# Patient Record
Sex: Female | Born: 1985 | Race: Black or African American | Hispanic: No | Marital: Single | Smoking: Never smoker
Health system: Southern US, Community
[De-identification: ages and names within clinical notes are randomized; demographics above are authoritative.]

## PROBLEM LIST (undated history)

## (undated) DIAGNOSIS — Z789 Other specified health status: Secondary | ICD-10-CM

---
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
## 2007-09-06 ENCOUNTER — Inpatient Hospital Stay (HOSPITAL_COMMUNITY): Admission: AD | Admit: 2007-09-06 | Discharge: 2007-09-06 | Payer: Self-pay | Admitting: Obstetrics & Gynecology

## 2007-09-12 ENCOUNTER — Inpatient Hospital Stay (HOSPITAL_COMMUNITY): Admission: AD | Admit: 2007-09-12 | Discharge: 2007-09-12 | Payer: Self-pay | Admitting: Obstetrics

## 2007-10-30 ENCOUNTER — Inpatient Hospital Stay (HOSPITAL_COMMUNITY): Admission: AD | Admit: 2007-10-30 | Discharge: 2007-11-05 | Payer: Self-pay | Admitting: Obstetrics

## 2007-11-02 ENCOUNTER — Encounter (INDEPENDENT_AMBULATORY_CARE_PROVIDER_SITE_OTHER): Payer: Self-pay | Admitting: Obstetrics

## 2007-11-14 ENCOUNTER — Emergency Department (HOSPITAL_COMMUNITY): Admission: EM | Admit: 2007-11-14 | Discharge: 2007-11-14 | Payer: Self-pay | Admitting: Family Medicine

## 2007-12-18 ENCOUNTER — Emergency Department (HOSPITAL_COMMUNITY): Admission: EM | Admit: 2007-12-18 | Discharge: 2007-12-18 | Payer: Self-pay | Admitting: Emergency Medicine

## 2007-12-19 ENCOUNTER — Emergency Department (HOSPITAL_COMMUNITY): Admission: EM | Admit: 2007-12-19 | Discharge: 2007-12-19 | Payer: Self-pay | Admitting: Emergency Medicine

## 2008-09-02 ENCOUNTER — Emergency Department (HOSPITAL_COMMUNITY): Admission: EM | Admit: 2008-09-02 | Discharge: 2008-09-02 | Payer: Self-pay | Admitting: Emergency Medicine

## 2008-09-03 ENCOUNTER — Emergency Department (HOSPITAL_COMMUNITY): Admission: EM | Admit: 2008-09-03 | Discharge: 2008-09-03 | Payer: Self-pay | Admitting: Emergency Medicine

## 2009-04-28 ENCOUNTER — Emergency Department (HOSPITAL_COMMUNITY): Admission: EM | Admit: 2009-04-28 | Discharge: 2009-04-28 | Payer: Self-pay | Admitting: Emergency Medicine

## 2010-09-15 ENCOUNTER — Emergency Department (HOSPITAL_COMMUNITY): Admission: EM | Admit: 2010-09-15 | Discharge: 2010-09-15 | Payer: Self-pay | Admitting: Emergency Medicine

## 2011-01-30 ENCOUNTER — Inpatient Hospital Stay (HOSPITAL_COMMUNITY): Admit: 2011-01-30 | Payer: Self-pay | Admitting: Obstetrics & Gynecology

## 2011-04-01 ENCOUNTER — Emergency Department (HOSPITAL_COMMUNITY)
Admission: EM | Admit: 2011-04-01 | Discharge: 2011-04-01 | Disposition: A | Discharge status: Home / Self Care | Payer: Self-pay | Attending: Emergency Medicine | Admitting: Emergency Medicine

## 2011-04-01 DIAGNOSIS — R51 Headache: Secondary | ICD-10-CM | POA: Insufficient documentation

## 2011-04-01 DIAGNOSIS — W2209XA Striking against other stationary object, initial encounter: Secondary | ICD-10-CM | POA: Insufficient documentation

## 2011-04-01 DIAGNOSIS — Y990 Civilian activity done for income or pay: Secondary | ICD-10-CM | POA: Insufficient documentation

## 2011-04-01 DIAGNOSIS — S0990XA Unspecified injury of head, initial encounter: Secondary | ICD-10-CM | POA: Insufficient documentation

## 2011-04-26 NOTE — Op Note (Signed)
NAMEMADDEN, PIAZZA               ACCOUNT NO.:  [id number]   MEDICAL RECORD NO.:  [id number]          PATIENT TYPE:  INP   LOCATION:  9303                          FACILITY:  [facility]   PHYSICIAN:  Kathreen Cosier, M.D.DATE OF BIRTH:  06-07-1986   DATE OF PROCEDURE:  11/02/2007  DATE OF DISCHARGE:                               OPERATIVE REPORT   PREOPERATIVE DIAGNOSIS:  Intrauterine pregnancy at 34 weeks and 3 days  with prolonged ruptured membranes, breech presentation, and early labor.   POSTOPERATIVE DIAGNOSIS:  Intrauterine pregnancy at 34 weeks and 3 days  with prolonged ruptured membranes, breech presentation, and early labor.   SURGEON:  Kathreen Cosier, M.D.   ANESTHESIA:  Spinal.   PROCEDURE:  The patient placed on the operating room table in the supine  position after the spinal administered.  Abdomen prepped and draped,  bladder emptied with Foley catheter.   Transverse suprapubic incision made and carried down to the rectus  fascia.  Fascia cleaned and incised the length of the incision.  The  recti muscles were retracted laterally.  The peritoneum incised  longitudinally.  Transverse incision made in the visceral peritoneum  above the bladder.  Bladder mobilized inferiorly.  Transverse lower  uterine incision made, and the patient delivered from the frank breech  position of a female, Apgar are 4 and 9, weighing 3 pounds 9.5 ounces.  Team in attendance.  The pH was 7.31.  The placenta was posterior and  removed manually and sent to pathology.  Uterine cavity cleaned with dry  laps.  Uterine incision closed in one layer with continuous suture of #1  chromic.  Hemostasis satisfactory.  Bladder flap reattached with 2-0  chromic.  Uterus well-contracted.  Tubes and ovaries normal.  Abdomen  closed in layers, peritoneum continuous suture of 0 chromic, fascia  continuous suture of 0 Dexon, skin closed with subcuticular stitch of 4-  0 Monocryl.  Blood loss 250  mL.           ______________________________  Kathreen Cosier, M.D.     BAM/MEDQ  D:  11/02/2007  T:  11/04/2007  Job:  [id number]

## 2011-04-26 NOTE — H&P (Signed)
NAMERASHEDA, LEDGER               ACCOUNT NO.:  [id number]   MEDICAL RECORD NO.:  [id number]          PATIENT TYPE:  INP   LOCATION:  9303                          FACILITY:  [facility]   PHYSICIAN:  Kathreen Cosier, M.D.DATE OF BIRTH:  01/21/1986   DATE OF ADMISSION:  10/30/2007  DATE OF DISCHARGE:                              HISTORY & PHYSICAL   HISTORY OF PRESENT ILLNESS:  The patient is an 25 year old primigravida,  [facility] December 12, 2007.  She was admitted on November 18 with ruptured  membranes which occurred spontaneously at 9:00 p.m. on the 17th.  She  was having occasional contractions.  Speculum exam showed large amount  yeast and a large amount of clear amniotic fluid from the cervix.  The  patient was started on ampicillin 2 grams IV every 6 hours and  dexamethasone 6 mg IM q. 12 hours for four doses.  She was started on  magnesium sulfate 4 grams loading and 2 grams an hour.  Ultrasound  revealed a frank breech presentation.  The patient was kept on bedrest  and used approximately six pads per day.  On the evening of November 21,  she started contracting every 5-7 minutes, and the cervix was 1 cm, 90%  with a breech at -3 station.  It was decided she would be delivered by C-  section because of breech presentation and ruptured membranes in labor.   PHYSICAL EXAMINATION:  GENERAL:  A well-developed female in no distress.  HEENT: Negative.  LUNGS: Clear.  HEART: Regular rhythm.  No murmurs or gallops.  BREASTS:  No masses.  ABDOMEN: Uterine size 32-34 weeks.  EXTREMITIES:  Negative.           ______________________________  Kathreen Cosier, M.D.     BAM/MEDQ  D:  11/02/2007  T:  11/03/2007  Job:  [id number]

## 2011-04-29 NOTE — Discharge Summary (Signed)
NAMEKRISTALYN, Patricia Parrish               ACCOUNT NO.:  [id number]   MEDICAL RECORD NO.:  [id number]          PATIENT TYPE:  INP   LOCATION:  9303                          [facility]ACILITY:  [facility]   PHYSICIAN:  Kathreen Cosier, M.D.DATE OF BIRTH:  Jul 20, 1986   DATE OF ADMISSION:  10/30/2007  DATE OF DISCHARGE:  11/05/2007                               DISCHARGE SUMMARY   HISTORY OF PRESENT ILLNESS:  The patient is an 25 year old gravida 1,  EDC December 12, 2007 who was admitted with ruptured membranes which  occurred on November 17.  She was having occasional contractions and she  had clear fluid.  She was 33+ weeks and she was given dexamethasone, and  started on magnesium sulfate and ampicillin 2 grams IV every 6 hours.  She was also noted to be a breech presentation.  On November 21, the  patient went into labor, and at that time she was delivered by C-section  and had a frank breech female 3 pounds, 9.5 ounces, Apgar 4 and 9, pH  7.31.  She did well.  Postop her hemoglobin was 11.2.  She was  discharged home on postoperative day #3, ambulatory, on a regular diet,  to see me in 6 weeks.   DISCHARGE DIAGNOSIS:  Status post premature ruptured membranes of 33-34  weeks, breech presentation, labor and primary low transverse cesarean  section because of presentation and the presence of labor.           ______________________________  Kathreen Cosier, M.D.     BAM/MEDQ  D:  11/21/2007  T:  11/21/2007  Job:  [id number]

## 2011-09-01 LAB — DIFFERENTIAL
Lymphocytes Relative: 8 — ABNORMAL LOW
Lymphs Abs: 0.6 — ABNORMAL LOW
Monocytes Relative: 6
Neutro Abs: 6.4
Neutrophils Relative %: 85 — ABNORMAL HIGH

## 2011-09-01 LAB — WOUND CULTURE

## 2011-09-01 LAB — CBC
MCV: 87.4
Platelets: 233
RBC: 4.38
WBC: 7.5

## 2011-09-01 LAB — BASIC METABOLIC PANEL
BUN: 5 — ABNORMAL LOW
Calcium: 9.6
Chloride: 107
Creatinine, Ser: 0.75
GFR calc Af Amer: >60
GFR calc non Af Amer: >60

## 2011-09-12 LAB — POCT I-STAT, CHEM 8
BUN: 10
Chloride: 107
Creatinine, Ser: 0.9
Glucose, Bld: 95
Hemoglobin: 12.6
Potassium: 3.7
Sodium: 142

## 2011-09-12 LAB — POCT CARDIAC MARKERS
CKMB, poc: <1 — ABNORMAL LOW
Troponin i, poc: <0.05

## 2011-09-20 LAB — CBC
HCT: 41
Hemoglobin: 11.2 — ABNORMAL LOW
Hemoglobin: 14.1
MCHC: 34.1
MCV: 94.6
RBC: 3.47 — ABNORMAL LOW
RDW: 13.2
WBC: 11.9 — ABNORMAL HIGH
WBC: 7.6

## 2011-09-22 LAB — URINE MICROSCOPIC-ADD ON

## 2011-09-22 LAB — URINALYSIS, ROUTINE W REFLEX MICROSCOPIC
Glucose, UA: NEGATIVE
Glucose, UA: NEGATIVE
Hgb urine dipstick: NEGATIVE
Ketones, ur: NEGATIVE
Specific Gravity, Urine: 1.02
pH: 6
pH: 7

## 2015-05-26 ENCOUNTER — Emergency Department: Payer: Self-pay

## 2015-05-26 ENCOUNTER — Emergency Department
Admission: EM | Admit: 2015-05-26 | Discharge: 2015-05-26 | Disposition: A | Discharge status: Home / Self Care | Payer: Self-pay | Attending: Emergency Medicine | Admitting: Emergency Medicine

## 2015-05-26 ENCOUNTER — Encounter: Payer: Self-pay | Admitting: *Deleted

## 2015-05-26 DIAGNOSIS — M779 Enthesopathy, unspecified: Secondary | ICD-10-CM | POA: Insufficient documentation

## 2015-05-26 DIAGNOSIS — M7750 Other enthesopathy of unspecified foot: Secondary | ICD-10-CM

## 2015-05-26 MED ORDER — NAPROXEN 500 MG PO TABS
500.0000 mg | ORAL_TABLET | Freq: Once | ORAL | Status: AC
  Administered 2015-05-26: 500 mg via ORAL

## 2015-05-26 MED ORDER — NAPROXEN 500 MG PO TABS
ORAL_TABLET | ORAL | Status: AC
  Administered 2015-05-26: 500 mg via ORAL
  Filled 2015-05-26: qty 1

## 2015-05-26 MED ORDER — TRAMADOL HCL 50 MG PO TABS
50.0000 mg | ORAL_TABLET | Freq: Once | ORAL | Status: AC
  Administered 2015-05-26: 50 mg via ORAL

## 2015-05-26 MED ORDER — TRAMADOL HCL 50 MG PO TABS
ORAL_TABLET | ORAL | Status: AC
  Administered 2015-05-26: 50 mg via ORAL
  Filled 2015-05-26: qty 1

## 2015-05-26 MED ORDER — MELOXICAM 15 MG PO TABS: 15.0000 mg | ORAL_TABLET | Freq: Every day | ORAL | Status: DC

## 2015-05-26 MED ORDER — TRAMADOL HCL 50 MG PO TABS: 50.0000 mg | ORAL_TABLET | Freq: Four times a day (QID) | ORAL | Status: DC | PRN

## 2015-05-26 NOTE — Discharge Instructions (Signed)

## 2015-05-26 NOTE — ED Provider Notes (Signed)
[facility] Emergency Department Provider Note  ____________________________________________  Time seen: Approximately 7:20 PM  I have reviewed the triage vital signs and the nursing notes.   HISTORY  Chief Complaint Foot Pain  Right foot pain  HPI Patricia Parrish is a 29 y.o. female who presents to the emergency department for sudden onset right foot pain. She denies injury or previous symptoms of the same. Pain is 7 out of 10. She has not taken any medications at home for the pain. Pain is worse with ambulation.She tells me that she is a Production designer, theatre/television/film at [employer] and is on her feet several hours per day.  History reviewed. No pertinent past medical history.  There are no active problems to display for this patient.   History reviewed. No pertinent past surgical history.  Current Outpatient Rx  Name  Route  Sig  Dispense  Refill  . meloxicam (MOBIC) 15 MG tablet   Oral   Take 1 tablet (15 mg total) by mouth daily.   30 tablet   2   . traMADol (ULTRAM) 50 MG tablet   Oral   Take 1 tablet (50 mg total) by mouth every 6 (six) hours as needed.   9 tablet   0     Allergies Review of patient's allergies indicates no known allergies.  History reviewed. No pertinent family history.  Social History History  Substance Use Topics  . Smoking status: Never Smoker   . Smokeless tobacco: Not on file  . Alcohol Use: No    Review of Systems Constitutional: No recent illness. Eyes: No visual changes. ENT: No sore throat. Cardiovascular: Denies chest pain or palpitations. Respiratory: Denies shortness of breath. Gastrointestinal: No abdominal pain.  Genitourinary: Negative for dysuria. Musculoskeletal: Pain in right foot Skin: Negative for rash. Neurological: Negative for headaches, focal weakness or numbness. 10-point ROS otherwise negative.  ____________________________________________   PHYSICAL EXAM:  VITAL SIGNS: ED Triage Vitals  Enc  Vitals Group     BP --      Pulse --      Resp --      Temp --      Temp src --      SpO2 --      Weight --      Height --      Head Cir --      Peak Flow --      Pain Score --      Pain Loc --      Pain Edu? --      Excl. in GC? --     Constitutional: Alert and oriented. Well appearing and in no acute distress. Eyes: Conjunctivae are normal. EOMI. Head: Atraumatic. Nose: No congestion/rhinnorhea. Neck: No stridor.  Respiratory: Normal respiratory effort.   Musculoskeletal: Right midfoot tenderness to palpation without obvious injury. [place] ankle rules are negative. Neurologic:  Normal speech and language. No gross focal neurologic deficits are appreciated. Speech is normal. Gait not tested due to pain. Skin:  Skin is warm, dry and intact. Atraumatic. Psychiatric: Mood and affect are normal. Speech and behavior are normal.  ____________________________________________   LABS (all labs ordered are listed, but only abnormal results are displayed)  Labs Reviewed - No data to display ____________________________________________  RADIOLOGY  Negative for acute abnormality ____________________________________________   PROCEDURES  Procedure(s) performed: Ace bandage applied by ER tech. Neurovascularly intact post-splint application.   ____________________________________________   INITIAL IMPRESSION / ASSESSMENT AND PLAN / ED COURSE  Pertinent labs &  imaging results that were available during my care of the patient were reviewed by me and considered in my medical decision making (see chart for details).  Patient was advised to keep the foot wrapped when she has to work. She was advised to follow up with the podiatrist for symptoms are not improving over the week. She was advised to return to the emergency department for symptoms change or worsen if she is unable to schedule an appointment. ____________________________________________   FINAL CLINICAL IMPRESSION(S)  / ED DIAGNOSES  Final diagnoses:  Tendonitis of ankle or foot      Chinita Pester, FNP 05/26/15 2013  Minna Antis, MD 05/26/15 [phone]

## 2016-09-29 ENCOUNTER — Emergency Department (HOSPITAL_COMMUNITY)
Admission: EM | Admit: 2016-09-29 | Discharge: 2016-09-29 | Disposition: A | Discharge status: Home / Self Care | Payer: No Typology Code available for payment source | Attending: Physician Assistant | Admitting: Physician Assistant

## 2016-09-29 ENCOUNTER — Encounter (HOSPITAL_COMMUNITY): Payer: Self-pay

## 2016-09-29 DIAGNOSIS — Y999 Unspecified external cause status: Secondary | ICD-10-CM | POA: Diagnosis not present

## 2016-09-29 DIAGNOSIS — M25512 Pain in left shoulder: Secondary | ICD-10-CM | POA: Insufficient documentation

## 2016-09-29 DIAGNOSIS — Y939 Activity, unspecified: Secondary | ICD-10-CM | POA: Diagnosis not present

## 2016-09-29 DIAGNOSIS — Y92410 Unspecified street and highway as the place of occurrence of the external cause: Secondary | ICD-10-CM | POA: Insufficient documentation

## 2016-09-29 DIAGNOSIS — M7918 Myalgia, other site: Secondary | ICD-10-CM

## 2016-09-29 MED ORDER — IBUPROFEN 400 MG PO TABS: 400.0000 mg | ORAL_TABLET | Freq: Once | ORAL | Status: DC

## 2016-09-29 MED ORDER — METHOCARBAMOL 500 MG PO TABS: 500.0000 mg | ORAL_TABLET | Freq: Two times a day (BID) | ORAL | 0 refills | Status: DC

## 2016-09-29 MED ORDER — METHOCARBAMOL 500 MG PO TABS: 500.0000 mg | ORAL_TABLET | Freq: Once | ORAL | Status: DC
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
## 2017-05-14 ENCOUNTER — Emergency Department: Payer: Self-pay

## 2017-05-14 ENCOUNTER — Emergency Department
Admission: EM | Admit: 2017-05-14 | Discharge: 2017-05-14 | Disposition: A | Discharge status: Home / Self Care | Payer: Self-pay | Attending: Emergency Medicine | Admitting: Emergency Medicine

## 2017-05-14 ENCOUNTER — Encounter: Payer: Self-pay | Admitting: Emergency Medicine

## 2017-05-14 DIAGNOSIS — M436 Torticollis: Secondary | ICD-10-CM | POA: Insufficient documentation

## 2017-05-14 MED ORDER — KETOROLAC TROMETHAMINE 30 MG/ML IJ SOLN
15.0000 mg | Freq: Once | INTRAMUSCULAR | Status: AC
  Administered 2017-05-14: 15 mg via INTRAMUSCULAR
  Filled 2017-05-14: qty 1

## 2017-05-14 MED ORDER — KETOROLAC TROMETHAMINE 10 MG PO TABS: 10.0000 mg | ORAL_TABLET | Freq: Four times a day (QID) | ORAL | 0 refills | Status: DC | PRN

## 2017-05-14 MED ORDER — CYCLOBENZAPRINE HCL 10 MG PO TABS: 10.0000 mg | ORAL_TABLET | Freq: Three times a day (TID) | ORAL | 0 refills | Status: DC | PRN

## 2017-05-14 MED ORDER — ORPHENADRINE CITRATE 30 MG/ML IJ SOLN
60.0000 mg | Freq: Two times a day (BID) | INTRAMUSCULAR | Status: DC
  Administered 2017-05-14: 60 mg via INTRAMUSCULAR
  Filled 2017-05-14: qty 2
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
## 2018-01-25 ENCOUNTER — Encounter: Payer: Self-pay | Admitting: Internal Medicine

## 2018-01-25 ENCOUNTER — Ambulatory Visit (INDEPENDENT_AMBULATORY_CARE_PROVIDER_SITE_OTHER): Payer: Managed Care, Other (non HMO) | Admitting: Internal Medicine

## 2018-01-25 VITALS — BP 114/72 | HR 70 | Temp 98.2°F | Ht 65.75 in | Wt 126.0 lb

## 2018-01-25 DIAGNOSIS — Z0000 Encounter for general adult medical examination without abnormal findings: Secondary | ICD-10-CM

## 2018-01-25 LAB — LIPID PANEL
Cholesterol: 97 mg/dL (ref 0–200)
HDL: 55.1 mg/dL (ref >39.00)
LDL Cholesterol: 32 mg/dL (ref 0–99)
NONHDL: 41.9
Total CHOL/HDL Ratio: 2
Triglycerides: 48 mg/dL (ref 0.0–149.0)
VLDL: 9.6 mg/dL (ref 0.0–40.0)

## 2018-01-25 LAB — CBC
HEMATOCRIT: 35.7 % — AB (ref 36.0–46.0)
HEMOGLOBIN: 11.5 g/dL — AB (ref 12.0–15.0)
MCHC: 32.3 g/dL (ref 30.0–36.0)
MCV: 86.5 fl (ref 78.0–100.0)
Platelets: 189 10*3/uL (ref 150.0–400.0)
RBC: 4.13 Mil/uL (ref 3.87–5.11)
RDW: 13.9 % (ref 11.5–15.5)
WBC: 4.1 10*3/uL (ref 4.0–10.5)

## 2018-01-25 LAB — COMPREHENSIVE METABOLIC PANEL
ALK PHOS: 54 U/L (ref 39–117)
ALT: 8 U/L (ref 0–35)
AST: 10 U/L (ref 0–37)
Albumin: 4.2 g/dL (ref 3.5–5.2)
BUN: 8 mg/dL (ref 6–23)
CO2: 29 meq/L (ref 19–32)
Calcium: 8.6 mg/dL (ref 8.4–10.5)
Chloride: 105 mEq/L (ref 96–112)
Creatinine, Ser: 0.82 mg/dL (ref 0.40–1.20)
GFR: 105.89 mL/min (ref >60.00)
GLUCOSE: 88 mg/dL (ref 70–99)
POTASSIUM: 3.7 meq/L (ref 3.5–5.1)
Sodium: 139 mEq/L (ref 135–145)
Total Bilirubin: 0.7 mg/dL (ref 0.2–1.2)
Total Protein: 7 g/dL (ref 6.0–8.3)

## 2018-01-25 NOTE — Progress Notes (Signed)
HPI  Pt presents to the clinic today to establish care. She has not had a PCP in many years. She would like her annual exam today.  Flu: never Tetanus: unsure Pap Smear: 2017, [facility] Dentist : as needed  Diet: She does eat meat. She consumes fruits and veggies daily. She occassionally eats fried foods. She drinks mostly water and juice. Exercise: Walking.  History reviewed. No pertinent past medical history.  No current outpatient medications on file.   No current facility-administered medications for this visit.     No Known Allergies  Family History  Problem Relation Age of Onset  . Hypertension Mother   . Hypertension Father   . Hypertension Maternal Grandmother   . Hypertension Maternal Grandfather   . Diabetes Paternal Grandmother   . Diabetes Paternal Grandfather     Social History   Socioeconomic History  . Marital status: Single    Spouse name: Not on file  . Number of children: Not on file  . Years of education: Not on file  . Highest education level: Not on file  Social Needs  . Financial resource strain: Not on file  . Food insecurity - worry: Not on file  . Food insecurity - inability: Not on file  . Transportation needs - medical: Not on file  . Transportation needs - non-medical: Not on file  Occupational History  . Not on file  Tobacco Use  . Smoking status: Never Smoker  . Smokeless tobacco: Never Used  Substance and Sexual Activity  . Alcohol use: Yes    Comment: occasional   . Drug use: No  . Sexual activity: Not on file  Other Topics Concern  . Not on file  Social History Narrative  . Not on file    ROS:  Constitutional: Denies fever, malaise, fatigue, headache or abrupt weight changes.  HEENT: Denies eye pain, eye redness, ear pain, ringing in the ears, wax buildup, runny nose, nasal congestion, bloody nose, or sore throat. Respiratory: Denies difficulty breathing, shortness of breath, cough or sputum  production.   Cardiovascular: Denies chest pain, chest tightness, palpitations or swelling in the hands or feet.  Gastrointestinal: Denies abdominal pain, bloating, constipation, diarrhea or blood in the stool.  GU: Denies frequency, urgency, pain with urination, blood in urine, odor or discharge. Musculoskeletal: Denies decrease in range of motion, difficulty with gait, muscle pain or joint pain and swelling.  Skin: Denies redness, rashes, lesions or ulcercations.  Neurological: Denies dizziness, difficulty with memory, difficulty with speech or problems with balance and coordination.  Psych: Denies anxiety, depression, SI/HI.  No other specific complaints in a complete review of systems (except as listed in HPI above).  PE:  BP 114/72   Pulse 70   Temp 98.2 F (36.8 C) (Oral)   Ht 5' 5.75" (1.67 m)   Wt 126 lb (57.2 kg)   LMP 01/24/2018   SpO2 100%   BMI 20.49 kg/m  Wt Readings from Last 3 Encounters:  01/25/18 126 lb (57.2 kg)  05/14/17 125 lb (56.7 kg)  05/26/15 123 lb (55.8 kg)    General: Appears her stated age, well developed, well nourished in NAD. HEENT: Head: normal shape and size; Eyes: sclera Ghazarian, no icterus, conjunctiva pink, PERRLA and EOMs intact; Ears: Tm's gray and intact, normal light reflex;Throat/Mouth: Teeth present, mucosa pink and moist, no lesions or ulcerations noted.  Neck: Neck supple, trachea midline. No masses, lumps or thyromegaly present.  Cardiovascular: Normal rate and rhythm. S1,S2  noted.  No murmur, rubs or gallops noted. No JVD or BLE edema.  Pulmonary/Chest: Normal effort and positive vesicular breath sounds. No respiratory distress. No wheezes, rales or ronchi noted.  Abdomen: Soft and nontender. Normal bowel sounds. No distention or masses noted. Liver, spleen and kidneys non palpable. Musculoskeletal:  Strength 5/5 BUE/BLE. No difficulty with gait.  Neurological: Alert and oriented. Cranial nerves II-XII grossly intact. Coordination  normal.  Psychiatric: Mood and affect normal. Behavior is normal. Judgment and thought content normal.    BMET    Component Value Date/Time   NA 142 09/02/2008 2108   K 3.7 09/02/2008 2108   CL 107 09/02/2008 2108   CO2 25 12/19/2007 1548   GLUCOSE 95 09/02/2008 2108   BUN 10 09/02/2008 2108   CREATININE 0.9 09/02/2008 2108   CALCIUM 9.6 12/19/2007 1548   GFRNONAA >60 12/19/2007 1548   GFRAA  12/19/2007 1548    >60        The eGFR has been calculated using the MDRD equation. This calculation has not been validated in all clinical situations. eGFR's persistently <60 mL/min signify possible Chronic Kidney Disease.    Lipid Panel  No results found for: CHOL, TRIG, HDL, CHOLHDL, VLDL, LDLCALC  CBC    Component Value Date/Time   WBC 7.5 12/19/2007 1548   RBC 4.38 12/19/2007 1548   HGB 12.6 09/02/2008 2108   HCT 37.0 09/02/2008 2108   PLT 233 12/19/2007 1548   MCV 87.4 12/19/2007 1548   MCHC 33.8 12/19/2007 1548   RDW 13.2 12/19/2007 1548   LYMPHSABS 0.6 (L) 12/19/2007 1548   MONOABS 0.5 12/19/2007 1548   EOSABS 0.0 12/19/2007 1548   BASOSABS 0.0 12/19/2007 1548    Hgb A1C No results found for: HGBA1C   Assessment and Plan:  Preventative Health Maintenance:  She declines flu or tetanus vaccine Pap smear UTD Encouraged her to consume a balanced diet and exercise regimen Advised her to see a dentist annually Will check CBC, CMET, and Lipid profile today  RTC in 1 year, sooner if needed Webb Silversmith, NP

## 2018-01-25 NOTE — Patient Instructions (Signed)

## 2018-02-27 ENCOUNTER — Other Ambulatory Visit: Payer: Self-pay

## 2018-02-27 ENCOUNTER — Encounter: Payer: Self-pay | Admitting: Emergency Medicine

## 2018-02-27 DIAGNOSIS — J040 Acute laryngitis: Secondary | ICD-10-CM | POA: Diagnosis not present

## 2018-02-27 DIAGNOSIS — R05 Cough: Secondary | ICD-10-CM | POA: Diagnosis not present

## 2018-02-27 NOTE — ED Triage Notes (Signed)
Pt arrived to the ED for complaints of loosing her voice and cough for one day. Pt denies any fever or being around anyone that is sick. Pt is AOx4 in no apparent distress.

## 2018-02-28 ENCOUNTER — Emergency Department
Admission: EM | Admit: 2018-02-28 | Discharge: 2018-02-28 | Disposition: A | Discharge status: Home / Self Care | Payer: Managed Care, Other (non HMO) | Attending: Emergency Medicine | Admitting: Emergency Medicine

## 2018-02-28 DIAGNOSIS — R059 Cough, unspecified: Secondary | ICD-10-CM

## 2018-02-28 DIAGNOSIS — J040 Acute laryngitis: Secondary | ICD-10-CM

## 2018-02-28 DIAGNOSIS — R05 Cough: Secondary | ICD-10-CM

## 2018-02-28 MED ORDER — BENZONATATE 100 MG PO CAPS: 100.0000 mg | ORAL_CAPSULE | Freq: Four times a day (QID) | ORAL | 0 refills | Status: DC | PRN

## 2018-02-28 NOTE — Discharge Instructions (Signed)
Please follow up with your primary care physician for further evaluation of your symptoms.  °

## 2018-02-28 NOTE — ED Provider Notes (Signed)
[facility] Emergency Department Provider Note   ____________________________________________   First MD Initiated Contact with Patient 02/28/18 [phone]     (approximate)  I have reviewed the triage vital signs and the nursing notes.   HISTORY  Chief Complaint Cough    HPI Patricia Parrish is a 32 y.o. female who comes into the hospital todaywith some cough and hoarseness of her voice.  The patient states that she lost her voice approximately 5 days ago and for the past 2 days she has had a bad cough.  The patient reports that the cough is mainly during the night and in the morning.  The patient has not taken any medication for her symptoms and she denies any sick contacts or fevers.  The patient states that today at work she was feeling weak and had some shortness of breath.  The patient reports that the cough is nonproductive.  She does have some chest discomfort when she coughs but she denies nausea or vomiting.  The patient is here today for evaluation of her symptoms.   History reviewed. No pertinent past medical history.  There are no active problems to display for this patient.   History reviewed. No pertinent surgical history.  Prior to Admission medications   Medication Sig Start Date End Date Taking? Authorizing Provider  benzonatate (TESSALON PERLES) 100 MG capsule Take 1 capsule (100 mg total) by mouth every 6 (six) hours as needed for cough. 02/28/18   Rebecka ApleyWebster, Allison P, MD    Allergies Patient has no known allergies.  Family History  Problem Relation Age of Onset  . Hypertension Mother   . Hypertension Father   . Hypertension Maternal Grandmother   . Hypertension Maternal Grandfather   . Diabetes Paternal Grandmother   . Diabetes Paternal Grandfather     Social History Social History   Tobacco Use  . Smoking status: Never Smoker  . Smokeless tobacco: Never Used  Substance Use Topics  . Alcohol use: Yes    Comment: occasional    . Drug use: No    Review of Systems  Constitutional: No fever/chills Eyes: No visual changes. ENT: No sore throat. Cardiovascular:  chest pain with cough Respiratory: cough and shortness of breath. Gastrointestinal: No abdominal pain.   Genitourinary: Negative for dysuria. Musculoskeletal: Negative for back pain. Skin: Negative for rash. Neurological: Negative for headaches,    ____________________________________________   PHYSICAL EXAM:  VITAL SIGNS: ED Triage Vitals  Enc Vitals Group     BP 02/27/18 2250 (!) 106/52     Pulse Rate 02/27/18 2250 84     Resp 02/27/18 2250 16     Temp 02/27/18 2250 98.1 F (36.7 C)     Temp Source 02/27/18 2250 Oral     SpO2 02/27/18 2250 100 %     Weight 02/27/18 2252 127 lb (57.6 kg)     Height 02/27/18 2252 5\' 6"  (1.676 m)     Head Circumference --      Peak Flow --      Pain Score 02/27/18 2305 0     Pain Loc --      Pain Edu? --      Excl. in GC? --     Constitutional: Alert and oriented. Well appearing and in no acute distress. Eyes: Conjunctivae are normal. PERRL. EOMI. Head: Atraumatic. Nose: No congestion/rhinnorhea. Mouth/Throat: Mucous membranes are moist.  Oropharynx non-erythematous. Cardiovascular: Normal rate, regular rhythm. Grossly normal heart sounds.  Good peripheral circulation. Respiratory:  Normal respiratory effort.  No retractions. Lungs CTAB. Gastrointestinal: Soft and nontender. No distention.  Musculoskeletal: No lower extremity tenderness nor edema.   Neurologic:  Normal speech and language.  Skin:  Skin is warm, dry and intact.  Psychiatric: Mood and affect are normal. S  ____________________________________________   LABS (all labs ordered are listed, but only abnormal results are displayed)  Labs Reviewed - No data to display ____________________________________________  EKG  none ____________________________________________  RADIOLOGY  ED MD interpretation:  none  Official  radiology report(s): No results found.  ____________________________________________   PROCEDURES  Procedure(s) performed: None  Procedures  Critical Care performed: No  ____________________________________________   INITIAL IMPRESSION / ASSESSMENT AND PLAN / ED COURSE  As part of my medical decision making, I reviewed the following data within the electronic MEDICAL RECORD NUMBER Notes from prior ED visits and [place] Controlled Substance Database   This is a 32 year old female who comes into the hospital today with some hoarse voice and a cough for the past 2 days.  My differential diagnosis includes upper respiratory infection, laryngitis, bronchitis.  On evaluation the patient does have a hoarse voice but she does not have any respiratory distress.  Her lung sounds are clear with no wheezing.  The patient has not taken any medication for her symptoms.  I discussed the symptoms with the patient and informed her that with laryngitis which is likely due to a viral illness she probably has developed a cough from there but since she is having no respiratory distress and no wheezing as well as no fevers and no productive cough I do not feel that the patient needs any imaging studies at this time.  I will give the patient a prescription for benzonatate and have her follow-up with her primary care physician.  The patient will be discharged home.      ____________________________________________   FINAL CLINICAL IMPRESSION(S) / ED DIAGNOSES  Final diagnoses:  Cough  Laryngitis     ED Discharge Orders        Ordered    benzonatate (TESSALON PERLES) 100 MG capsule  Every 6 hours PRN     02/28/18 0354       Note:  This document was prepared using Dragon voice recognition software and may include unintentional dictation errors.    Rebecka Apley, MD 02/28/18 [phone]

## 2018-03-02 ENCOUNTER — Ambulatory Visit (INDEPENDENT_AMBULATORY_CARE_PROVIDER_SITE_OTHER): Payer: Managed Care, Other (non HMO) | Admitting: Internal Medicine

## 2018-03-02 ENCOUNTER — Other Ambulatory Visit (HOSPITAL_COMMUNITY)
Admission: RE | Admit: 2018-03-02 | Discharge: 2018-03-02 | Disposition: A | Discharge status: Home / Self Care | Payer: Managed Care, Other (non HMO) | Source: Ambulatory Visit | Attending: Internal Medicine | Admitting: Internal Medicine

## 2018-03-02 ENCOUNTER — Encounter: Payer: Self-pay | Admitting: Internal Medicine

## 2018-03-02 VITALS — BP 102/60 | HR 78 | Temp 98.3°F | Wt 126.0 lb

## 2018-03-02 DIAGNOSIS — N3000 Acute cystitis without hematuria: Secondary | ICD-10-CM | POA: Diagnosis not present

## 2018-03-02 DIAGNOSIS — Z113 Encounter for screening for infections with a predominantly sexual mode of transmission: Secondary | ICD-10-CM | POA: Insufficient documentation

## 2018-03-02 DIAGNOSIS — Z124 Encounter for screening for malignant neoplasm of cervix: Secondary | ICD-10-CM

## 2018-03-02 DIAGNOSIS — N760 Acute vaginitis: Secondary | ICD-10-CM | POA: Insufficient documentation

## 2018-03-02 DIAGNOSIS — B373 Candidiasis of vulva and vagina: Secondary | ICD-10-CM | POA: Diagnosis not present

## 2018-03-02 DIAGNOSIS — B9689 Other specified bacterial agents as the cause of diseases classified elsewhere: Secondary | ICD-10-CM | POA: Insufficient documentation

## 2018-03-02 DIAGNOSIS — R1030 Lower abdominal pain, unspecified: Secondary | ICD-10-CM | POA: Diagnosis not present

## 2018-03-02 DIAGNOSIS — R8290 Unspecified abnormal findings in urine: Secondary | ICD-10-CM | POA: Diagnosis not present

## 2018-03-02 LAB — POC URINALSYSI DIPSTICK (AUTOMATED)
BILIRUBIN UA: NEGATIVE
GLUCOSE UA: NEGATIVE
KETONES UA: NEGATIVE
Nitrite, UA: POSITIVE
PH UA: 6 (ref 5.0–8.0)
Spec Grav, UA: >=1.03 — AB (ref 1.010–1.025)
Urobilinogen, UA: 0.2 E.U./dL

## 2018-03-02 MED ORDER — SULFAMETHOXAZOLE-TRIMETHOPRIM 800-160 MG PO TABS: 1.0000 | ORAL_TABLET | Freq: Two times a day (BID) | ORAL | 0 refills | Status: DC

## 2018-03-02 MED ORDER — METRONIDAZOLE 500 MG PO TABS: 500.0000 mg | ORAL_TABLET | Freq: Two times a day (BID) | ORAL | 0 refills | Status: DC

## 2018-03-04 ENCOUNTER — Encounter: Payer: Self-pay | Admitting: Internal Medicine

## 2018-03-04 LAB — URINE CULTURE
MICRO NUMBER: 90363282
SPECIMEN QUALITY:: ADEQUATE

## 2018-03-04 NOTE — Progress Notes (Signed)
HPI  Pt presents to the clinic today with c/o lower abdominal pain, vaginal discharge and odor. She reports this started 1 week ago. She describes the pain as cramping. The discharge is thin and Depuy. She denies abnormal bleeding, urinary urgency, frequency or dysuria. She is sexually active. She has not tried anything OTC for this.   Review of Systems  No past medical history on file.  Family History  Problem Relation Age of Onset  . Hypertension Mother   . Hypertension Father   . Hypertension Maternal Grandmother   . Hypertension Maternal Grandfather   . Diabetes Paternal Grandmother   . Diabetes Paternal Grandfather     Social History   Socioeconomic History  . Marital status: Single    Spouse name: Not on file  . Number of children: Not on file  . Years of education: Not on file  . Highest education level: Not on file  Occupational History  . Not on file  Social Needs  . Financial resource strain: Not on file  . Food insecurity:    Worry: Not on file    Inability: Not on file  . Transportation needs:    Medical: Not on file    Non-medical: Not on file  Tobacco Use  . Smoking status: Never Smoker  . Smokeless tobacco: Never Used  Substance and Sexual Activity  . Alcohol use: Yes    Comment: occasional   . Drug use: No  . Sexual activity: Not on file  Lifestyle  . Physical activity:    Days per week: Not on file    Minutes per session: Not on file  . Stress: Not on file  Relationships  . Social connections:    Talks on phone: Not on file    Gets together: Not on file    Attends religious service: Not on file    Active member of club or organization: Not on file    Attends meetings of clubs or organizations: Not on file    Relationship status: Not on file  . Intimate partner violence:    Fear of current or ex partner: Not on file    Emotionally abused: Not on file    Physically abused: Not on file    Forced sexual activity: Not on file  Other Topics  Concern  . Not on file  Social History Narrative  . Not on file    No Known Allergies   Constitutional: Denies fever, malaise, fatigue, headache or abrupt weight changes.   GU: Pt reports vaginal discharge and odor. Denies urgency, frequency, dysuria, burning sensation, blood in urine. Skin: Denies redness, rashes, lesions or ulcercations.   No other specific complaints in a complete review of systems (except as listed in HPI above).    Objective:   Physical Exam  BP 102/60   Pulse 78   Temp 98.3 F (36.8 C) (Oral)   Wt 126 lb (57.2 kg)   LMP 02/21/2018 (Exact Date)   SpO2 98%   BMI 20.34 kg/m  Wt Readings from Last 3 Encounters:  03/02/18 126 lb (57.2 kg)  02/27/18 127 lb (57.6 kg)  01/25/18 126 lb (57.2 kg)    General: Appears her stated age, well developed, well nourished in NAD. Abdomen: Soft. Normal bowel sounds. No distention or masses noted.  Tender to palpation over the bladder area. No CVA tenderness. Pelvic: Normal female anatomy. Cervix without changes. Large thin, Shrader discharge noted. Foul odor.       Assessment & Plan:  Abdominal Pain, Vaginal Discharge and Odor:  Pap obtained today Urinalysis: 3+ leuks, positive nitrites, 2+ blood Will send urine culture eRx sent if for Septra BID x 5 days eRx for Flagyl 500 mg BID x 7 days (for presumed BV) OK to take AZO OTC Drink plenty of fluids  RTC as needed or if symptoms persist. Nicki Reaper, NP

## 2018-03-04 NOTE — Patient Instructions (Signed)

## 2018-03-06 ENCOUNTER — Other Ambulatory Visit: Payer: Self-pay | Admitting: Internal Medicine

## 2018-03-06 LAB — CYTOLOGY - PAP
Adequacy: ABSENT
BACTERIAL VAGINITIS: POSITIVE — AB
Candida vaginitis: POSITIVE — AB
Chlamydia: NEGATIVE
Diagnosis: NEGATIVE
NEISSERIA GONORRHEA: NEGATIVE
TRICH (WINDOWPATH): NEGATIVE

## 2018-03-06 MED ORDER — FLUCONAZOLE 150 MG PO TABS: 150.0000 mg | ORAL_TABLET | Freq: Once | ORAL | 0 refills | Status: AC

## 2018-03-09 MED ORDER — FLUCONAZOLE 150 MG PO TABS: 150.0000 mg | ORAL_TABLET | Freq: Once | ORAL | 0 refills | Status: AC

## 2018-03-09 NOTE — Addendum Note (Signed)
Addended by: Roena MaladyEVONTENNO, Jeriel Vivanco Y on: 03/09/2018 05:18 PM   Modules accepted: Orders

## 2018-07-11 ENCOUNTER — Emergency Department
Admission: EM | Admit: 2018-07-11 | Discharge: 2018-07-11 | Disposition: A | Discharge status: Home / Self Care | Payer: Managed Care, Other (non HMO) | Attending: Emergency Medicine | Admitting: Emergency Medicine

## 2018-07-11 ENCOUNTER — Other Ambulatory Visit: Payer: Self-pay

## 2018-07-11 DIAGNOSIS — R200 Anesthesia of skin: Secondary | ICD-10-CM | POA: Insufficient documentation

## 2018-07-11 DIAGNOSIS — Z5321 Procedure and treatment not carried out due to patient leaving prior to being seen by health care provider: Secondary | ICD-10-CM | POA: Diagnosis not present

## 2018-07-11 LAB — CBC
HCT: 36 % (ref 35.0–47.0)
Hemoglobin: 11.9 g/dL — ABNORMAL LOW (ref 12.0–16.0)
MCH: 28.9 pg (ref 26.0–34.0)
MCHC: 33.1 g/dL (ref 32.0–36.0)
MCV: 87.2 fL (ref 80.0–100.0)
PLATELETS: 173 10*3/uL (ref 150–440)
RBC: 4.13 MIL/uL (ref 3.80–5.20)
RDW: 14.8 % — AB (ref 11.5–14.5)
WBC: 3.7 10*3/uL (ref 3.6–11.0)

## 2018-07-11 LAB — COMPREHENSIVE METABOLIC PANEL
ALT: 14 U/L (ref 0–44)
AST: 12 U/L — AB (ref 15–41)
Albumin: 4.4 g/dL (ref 3.5–5.0)
Alkaline Phosphatase: 48 U/L (ref 38–126)
Anion gap: 6 (ref 5–15)
BUN: 8 mg/dL (ref 6–20)
CHLORIDE: 108 mmol/L (ref 98–111)
CO2: 25 mmol/L (ref 22–32)
Calcium: 8.5 mg/dL — ABNORMAL LOW (ref 8.9–10.3)
Creatinine, Ser: 0.87 mg/dL (ref 0.44–1.00)
GFR calc Af Amer: >60 mL/min (ref >60)
Glucose, Bld: 90 mg/dL (ref 70–99)
POTASSIUM: 3.7 mmol/L (ref 3.5–5.1)
SODIUM: 139 mmol/L (ref 135–145)
Total Bilirubin: 1.3 mg/dL — ABNORMAL HIGH (ref 0.3–1.2)
Total Protein: 7.5 g/dL (ref 6.5–8.1)

## 2018-07-11 LAB — APTT: APTT: 25 s (ref 24–36)

## 2018-07-11 LAB — TROPONIN I: Troponin I: <0.03 ng/mL (ref <0.03)

## 2018-07-11 NOTE — ED Triage Notes (Signed)
Pt states 2 days ago started with numbness in lower lip. States yesterday had numbness in lower jaw. States numbness is intermittent. Alert, oriented, ambulatory. Speaking in full sentences. No neuro deficits noted.

## 2018-07-17 ENCOUNTER — Telehealth: Payer: Self-pay | Admitting: Emergency Medicine

## 2018-07-17 NOTE — Telephone Encounter (Signed)
Called patient due to lwot to inquire about condition and follow up plans. Left message.   

## 2018-09-23 ENCOUNTER — Encounter (HOSPITAL_COMMUNITY): Payer: Self-pay | Admitting: Emergency Medicine

## 2018-09-23 ENCOUNTER — Other Ambulatory Visit: Payer: Self-pay

## 2018-09-23 ENCOUNTER — Emergency Department (HOSPITAL_COMMUNITY)
Admission: EM | Admit: 2018-09-23 | Discharge: 2018-09-23 | Disposition: A | Discharge status: Home / Self Care | Payer: Managed Care, Other (non HMO) | Attending: Emergency Medicine | Admitting: Emergency Medicine

## 2018-09-23 DIAGNOSIS — H1031 Unspecified acute conjunctivitis, right eye: Secondary | ICD-10-CM | POA: Insufficient documentation

## 2018-09-23 DIAGNOSIS — H1131 Conjunctival hemorrhage, right eye: Secondary | ICD-10-CM | POA: Insufficient documentation

## 2018-09-23 DIAGNOSIS — H5789 Other specified disorders of eye and adnexa: Secondary | ICD-10-CM | POA: Diagnosis present

## 2018-09-23 MED ORDER — ERYTHROMYCIN 5 MG/GM OP OINT: TOPICAL_OINTMENT | OPHTHALMIC | 0 refills | Status: DC

## 2018-09-23 NOTE — Discharge Instructions (Addendum)
Apply erythromycin to eye as prescribed. Follow up with your doctor.

## 2018-09-23 NOTE — ED Triage Notes (Signed)
C/o "blood spot" on R lateral eye since yesterday.   Reports eye felt irritated yesterday and had drainage this morning.  Denies pain.

## 2018-09-23 NOTE — ED Provider Notes (Signed)
[facility] EMERGENCY DEPARTMENT Provider Note   CSN: [id number] Arrival date & time: 09/23/18  1648     History   Chief Complaint Chief Complaint  Patient presents with  . Eye Problem    HPI Patricia Parrish is a 32 y.o. female.  32 year old female presents with complaint of redness to her right lateral eye.  Patient states that she has seasonal allergies and was sneezing a lot on Friday, woke up Saturday morning and noticed the redness to her eye and states her eye felt irritated.  Patient woke up this morning with yellow drainage from her eye with her eye matted shut and I continues to feel irritated.  She denies changes in vision, trauma to the eye but does not take blood thinners but does not wear glasses or contacts.  No other complaints or concerns.     History reviewed. No pertinent past medical history.  There are no active problems to display for this patient.   Past Surgical History:  Procedure Laterality Date  . CESAREAN SECTION       OB History   None      Home Medications    Prior to Admission medications   Medication Sig Start Date End Date Taking? Authorizing Provider  benzonatate (TESSALON PERLES) 100 MG capsule Take 1 capsule (100 mg total) by mouth every 6 (six) hours as needed for cough. 02/28/18   Rebecka Apley, MD  erythromycin ophthalmic ointment Place a 1/2 inch ribbon of ointment into the lower eyelid. 09/23/18   Jeannie Fend, PA-C  metroNIDAZOLE (FLAGYL) 500 MG tablet Take 1 tablet (500 mg total) by mouth 2 (two) times daily. 03/02/18   Lorre Munroe, NP  sulfamethoxazole-trimethoprim (BACTRIM DS,SEPTRA DS) 800-160 MG tablet Take 1 tablet by mouth 2 (two) times daily. 03/02/18   Lorre Munroe, NP    Family History Family History  Problem Relation Age of Onset  . Hypertension Mother   . Hypertension Father   . Hypertension Maternal Grandmother   . Hypertension Maternal Grandfather   . Diabetes Paternal  Grandmother   . Diabetes Paternal Grandfather     Social History Social History   Tobacco Use  . Smoking status: Never Smoker  . Smokeless tobacco: Never Used  Substance Use Topics  . Alcohol use: Yes    Comment: occasional   . Drug use: No     Allergies   Patient has no known allergies.   Review of Systems Review of Systems  Constitutional: Negative for chills and fever.  HENT: Positive for sneezing.   Eyes: Positive for discharge, redness and itching. Negative for photophobia, pain and visual disturbance.  Respiratory: Negative for cough.   Gastrointestinal: Negative for vomiting.  Skin: Negative for rash and wound.  Allergic/Immunologic: Negative for immunocompromised state.  Neurological: Negative for headaches.  Hematological: Negative for adenopathy. Does not bruise/bleed easily.  Psychiatric/Behavioral: Negative for confusion.  All other systems reviewed and are negative.    Physical Exam Updated Vital Signs BP 117/82 (BP Location: Right Arm)   Pulse 69   Temp 98.4 F (36.9 C) (Oral)   Resp 16   Ht 5\' 7"  (1.702 m)   Wt 59 kg   LMP 09/07/2018   SpO2 100%   BMI 20.36 kg/m   Physical Exam  Constitutional: She is oriented to person, place, and time. She appears well-developed and well-nourished. No distress.  HENT:  Head: Normocephalic and atraumatic.  Right Ear: External ear normal.  Left  Ear: External ear normal.  Nose: Nose normal.  Mouth/Throat: Oropharynx is clear and moist. No oropharyngeal exudate.  Eyes: Pupils are equal, round, and reactive to light. EOM are normal. Right eye exhibits no discharge. Left eye exhibits no discharge. Right conjunctiva is not injected. Right conjunctiva has a hemorrhage. Left conjunctiva is not injected.    Neck: Neck supple.  Cardiovascular: Intact distal pulses.  Pulmonary/Chest: Effort normal.  Neurological: She is alert and oriented to person, place, and time.  Skin: Skin is warm and dry. No rash noted. She  is not diaphoretic.  Psychiatric: She has a normal mood and affect. Her behavior is normal.  Nursing note and vitals reviewed.    ED Treatments / Results  Labs (all labs ordered are listed, but only abnormal results are displayed) Labs Reviewed - No data to display  EKG None  Radiology No results found.  Procedures Procedures (including critical care time)  Medications Ordered in ED Medications - No data to display   Initial Impression / Assessment and Plan / ED Course  I have reviewed the triage vital signs and the nursing notes.  Pertinent labs & imaging results that were available during my care of the patient were reviewed by me and considered in my medical decision making (see chart for details).  Clinical Course as of Sep 23 1740  Wynelle Link Sep 23, 2018  1740 subconjunctival hemorrhage right eye, possibly due to sneezing on Friday.  Light crusting to lashes, states she had yellow drainage from her eyes this morning with eye matted shut.  Patient given prescription for erythromycin ointment for her eye.   [LM]    Clinical Course User Index [LM] Jeannie Fend, PA-C   Final Clinical Impressions(s) / ED Diagnoses   Final diagnoses:  Subconjunctival hemorrhage of right eye  Acute conjunctivitis of right eye, unspecified acute conjunctivitis type    ED Discharge Orders         Ordered    erythromycin ophthalmic ointment     09/23/18 1723           Jeannie Fend, PA-C 09/23/18 1741    Vanetta Mulders, MD 10/02/18 [phone]

## 2019-07-31 ENCOUNTER — Encounter: Payer: Self-pay | Admitting: Emergency Medicine

## 2019-07-31 ENCOUNTER — Other Ambulatory Visit: Payer: Self-pay

## 2019-07-31 ENCOUNTER — Emergency Department
Admission: EM | Admit: 2019-07-31 | Discharge: 2019-07-31 | Disposition: A | Discharge status: Home / Self Care | Payer: Managed Care, Other (non HMO) | Attending: Emergency Medicine | Admitting: Emergency Medicine

## 2019-07-31 DIAGNOSIS — L84 Corns and callosities: Secondary | ICD-10-CM | POA: Insufficient documentation

## 2019-07-31 DIAGNOSIS — B070 Plantar wart: Secondary | ICD-10-CM | POA: Insufficient documentation

## 2019-07-31 MED ORDER — SALICYLIC ACID 17 % EX GEL: Freq: Every day | CUTANEOUS | 0 refills | Status: DC

## 2019-07-31 NOTE — ED Provider Notes (Signed)
[facility] Emergency Department Provider Note  ____________________________________________  Time seen: Approximately 4:24 PM  I have reviewed the triage vital signs and the nursing notes.   HISTORY  Chief Complaint Toe Pain    HPI Patricia Parrish is a 33 y.o. female presents to the emergency department with a "painful callus" of the left second toe.  Patient states that she has had similar symptoms in the past but they have never been painful.  Symptoms are worse when patient wears a narrow toe box shoe.  No bleeding from the affected area or malodorous smell.  No alleviating measures have been attempted.        History reviewed. No pertinent past medical history.  There are no active problems to display for this patient.   Past Surgical History:  Procedure Laterality Date  . CESAREAN SECTION      Prior to Admission medications   Medication Sig Start Date End Date Taking? Authorizing Provider  salicylic acid 17 % gel Apply topically daily. Apply daily for the next 12 weeks. 07/31/19   Lannie Fields, PA-C    Allergies Patient has no known allergies.  Family History  Problem Relation Age of Onset  . Hypertension Mother   . Hypertension Father   . Hypertension Maternal Grandmother   . Hypertension Maternal Grandfather   . Diabetes Paternal Grandmother   . Diabetes Paternal Grandfather     Social History Social History   Tobacco Use  . Smoking status: Never Smoker  . Smokeless tobacco: Never Used  Substance Use Topics  . Alcohol use: Yes    Comment: occasional   . Drug use: No     Review of Systems  Constitutional: No fever/chills Eyes: No visual changes. No discharge ENT: No upper respiratory complaints. Cardiovascular: no chest pain. Respiratory: no cough. No SOB. Gastrointestinal: No abdominal pain.  No nausea, no vomiting.  No diarrhea.  No constipation. Genitourinary: Negative for dysuria. No hematuria Musculoskeletal:  Negative for musculoskeletal pain. Skin: Patient has plantar wart.  Neurological: Negative for headaches, focal weakness or numbness. ____________________________________________   PHYSICAL EXAM:  VITAL SIGNS: ED Triage Vitals  Enc Vitals Group     BP 07/31/19 1539 121/77     Pulse Rate 07/31/19 1539 84     Resp 07/31/19 1539 16     Temp 07/31/19 1539 98.7 F (37.1 C)     Temp Source 07/31/19 1539 Oral     SpO2 07/31/19 1539 99 %     Weight 07/31/19 1540 126 lb (57.2 kg)     Height 07/31/19 1540 5\' 7"  (1.702 m)     Head Circumference --      Peak Flow --      Pain Score 07/31/19 1540 8     Pain Loc --      Pain Edu? --      Excl. in [place]? --      Constitutional: Alert and oriented. Well appearing and in no acute distress. Eyes: Conjunctivae are normal. PERRL. EOMI. Head: Atraumatic. Cardiovascular: Normal rate, regular rhythm. Normal S1 and S2.  Good peripheral circulation. Respiratory: Normal respiratory effort without tachypnea or retractions. Lungs CTAB. Good air entry to the bases with no decreased or absent breath sounds. Gastrointestinal: Bowel sounds 4 quadrants. Soft and nontender to palpation. No guarding or rigidity. No palpable masses. No distention. No CVA tenderness. Musculoskeletal: Full range of motion to all extremities. No gross deformities appreciated. Neurologic:  Normal speech and language. No gross focal  neurologic deficits are appreciated.  Skin: Patient has plantar wart between left second and third toes. Psychiatric: Mood and affect are normal. Speech and behavior are normal. Patient exhibits appropriate insight and judgement.   ____________________________________________   LABS (all labs ordered are listed, but only abnormal results are displayed)  Labs Reviewed - No data to display ____________________________________________  EKG   ____________________________________________  RADIOLOGY   No results  found.  ____________________________________________    PROCEDURES  Procedure(s) performed:    Procedures    Medications - No data to display   ____________________________________________   INITIAL IMPRESSION / ASSESSMENT AND PLAN / ED COURSE  Pertinent labs & imaging results that were available during my care of the patient were reviewed by me and considered in my medical decision making (see chart for details).  Review of the [place] CSRS was performed in accordance of the [facility] prior to dispensing any controlled drugs.           Assessment and plan Plantar wart 33 year old female presents to the emergency department with a plantar wart between the second and third left toes.  Patient was prescribed salicylic acid.  She was advised the salicylic acid daily for the next 12 weeks.  She was advised to follow-up with primary care if symptoms persist.  All patient questions were answered.   ____________________________________________  FINAL CLINICAL IMPRESSION(S) / ED DIAGNOSES  Final diagnoses:  Plantar wart      NEW MEDICATIONS STARTED DURING THIS VISIT:  ED Discharge Orders         Ordered    salicylic acid 17 % gel  Daily     07/31/19 1620              This chart was dictated using voice recognition software/Dragon. Despite best efforts to proofread, errors can occur which can change the meaning. Any change was purely unintentional.    Orvil FeilWoods, Jalina Blowers M, PA-C 07/31/19 1626    Sharman CheekStafford, Phillip, MD 07/31/19 2029

## 2019-07-31 NOTE — Discharge Instructions (Signed)
Apply 22% salicylic acid to affected area daily for the next 12 weeks. If symptoms persist after 12 weeks, please follow-up with primary care.

## 2019-07-31 NOTE — ED Triage Notes (Signed)
Patient states she noticed a callus between her left second and third toe a while ago. 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

## 2019-07-31 NOTE — ED Notes (Signed)
See triage note  Presents with possible "corn" on left 2 nd toe    Having increased pain with some swelling today

## 2020-04-27 ENCOUNTER — Ambulatory Visit (HOSPITAL_COMMUNITY)
Admission: EM | Admit: 2020-04-27 | Discharge: 2020-04-27 | Disposition: A | Discharge status: Home / Self Care | Payer: PRIVATE HEALTH INSURANCE | Attending: Family Medicine | Admitting: Family Medicine

## 2020-04-27 ENCOUNTER — Encounter (HOSPITAL_COMMUNITY): Payer: Self-pay

## 2020-04-27 ENCOUNTER — Emergency Department (HOSPITAL_COMMUNITY): Admission: EM | Admit: 2020-04-27 | Discharge: 2020-04-27 | Discharge status: Against Medical Advice | Payer: Self-pay

## 2020-04-27 ENCOUNTER — Other Ambulatory Visit: Payer: Self-pay

## 2020-04-27 DIAGNOSIS — N3090 Cystitis, unspecified without hematuria: Secondary | ICD-10-CM | POA: Insufficient documentation

## 2020-04-27 DIAGNOSIS — R1032 Left lower quadrant pain: Secondary | ICD-10-CM | POA: Diagnosis present

## 2020-04-27 DIAGNOSIS — Z20822 Contact with and (suspected) exposure to covid-19: Secondary | ICD-10-CM | POA: Diagnosis not present

## 2020-04-27 DIAGNOSIS — Z79899 Other long term (current) drug therapy: Secondary | ICD-10-CM | POA: Insufficient documentation

## 2020-04-27 LAB — POCT URINALYSIS DIP (DEVICE)
Bilirubin Urine: NEGATIVE
Glucose, UA: NEGATIVE mg/dL
Hgb urine dipstick: NEGATIVE
Leukocytes,Ua: NEGATIVE
Nitrite: POSITIVE — AB
Protein, ur: NEGATIVE mg/dL
Specific Gravity, Urine: >=1.03 (ref 1.005–1.030)
Urobilinogen, UA: 0.2 mg/dL (ref 0.0–1.0)
pH: 5.5 (ref 5.0–8.0)

## 2020-04-27 LAB — POC URINE PREG, ED: Preg Test, Ur: NEGATIVE

## 2020-04-27 MED ORDER — SULFAMETHOXAZOLE-TRIMETHOPRIM 800-160 MG PO TABS
1.0000 | ORAL_TABLET | Freq: Two times a day (BID) | ORAL | 0 refills | Status: DC
Start: 2020-04-27 — End: 2021-12-29

## 2020-04-27 NOTE — ED Triage Notes (Signed)
Pt is here with abdominal pain for 2 weeks now, pt has used a heating pad to relieve discomfort.

## 2020-04-27 NOTE — ED Provider Notes (Signed)
[facility]    CSN: [id number] Arrival date & time: 04/27/20  [place]      History   Chief Complaint Chief Complaint  Patient presents with  . Abdominal Pain    HPI Patricia Parrish is a 34 y.o. female.   She is presenting with left lower quadrant abdominal pain.  She had a fairly normal stool yesterday.  Denies any repetitive diarrhea.  Has had a history of cesarean.  No fevers or chills.  Pain is intermittent.  Does not seem to be associated with anything.  Has not been eating anything new or different.  HPI  History reviewed. No pertinent past medical history.  There are no problems to display for this patient.   Past Surgical History:  Procedure Laterality Date  . CESAREAN SECTION      OB History   No obstetric history on file.      Home Medications    Prior to Admission medications   Medication Sig Start Date End Date Taking? Authorizing Provider  amoxicillin (AMOXIL) 875 MG tablet SMARTSIG:1 Tablet(s) By Mouth Every 12 Hours 01/20/20   [provider]  HYDROcodone-acetaminophen (NORCO) 7.5-325 MG tablet Take 1 tablet by mouth every 4 (four) hours as needed. 01/20/20   [provider]  salicylic acid 17 % gel Apply topically daily. Apply daily for the next 12 weeks. 07/31/19   Lannie Fields, PA-C  sulfamethoxazole-trimethoprim (BACTRIM DS) 800-160 MG tablet Take 1 tablet by mouth 2 (two) times daily. 04/27/20   Rosemarie Ax, MD    Family History Family History  Problem Relation Age of Onset  . Hypertension Mother   . Hypertension Father   . Hypertension Maternal Grandmother   . Hypertension Maternal Grandfather   . Diabetes Paternal Grandmother   . Diabetes Paternal Grandfather     Social History Social History   Tobacco Use  . Smoking status: Never Smoker  . Smokeless tobacco: Never Used  Substance Use Topics  . Alcohol use: Yes    Comment: occasional   . Drug use: No     Allergies   Patient has no known  allergies.   Review of Systems Review of Systems  See HPI  Physical Exam Triage Vital Signs ED Triage Vitals  Enc Vitals Group     BP 04/27/20 1929 116/85     Pulse Rate 04/27/20 1929 73     Resp 04/27/20 1929 17     Temp 04/27/20 1929 98 F (36.7 C)     Temp Source 04/27/20 1929 Oral     SpO2 04/27/20 1929 100 %     Weight 04/27/20 1931 127 lb (57.6 kg)     Height --      Head Circumference --      Peak Flow --      Pain Score 04/27/20 1927 7     Pain Loc --      Pain Edu? --      Excl. in [place]? --    No data found.  Updated Vital Signs BP 116/85 (BP Location: Right Arm)   Pulse 73   Temp 98 F (36.7 C) (Oral)   Resp 17   Wt 57.6 kg   LMP 04/17/2020 (Exact Date)   SpO2 100%   BMI 19.89 kg/m   Visual Acuity Right Eye Distance:   Left Eye Distance:   Bilateral Distance:    Right Eye Near:   Left Eye Near:    Bilateral Near:  Physical Exam Gen: NAD, alert, cooperative with exam, well-appearing ENT: normal lips, normal nasal mucosa,  Eye: normal EOM, normal conjunctiva and lids CV:  no edema,  Resp: no accessory muscle use, non-labored,  GI: no masses, mild tenderness to palpation the left lower quadrant, soft, nondistended, positive bowel sounds   UC Treatments / Results  Labs (all labs ordered are listed, but only abnormal results are displayed) Labs Reviewed  POCT URINALYSIS DIP (DEVICE) - Abnormal; Notable for the following components:      Result Value   Ketones, ur TRACE (*)    Nitrite POSITIVE (*)    All other components within normal limits  SARS CORONAVIRUS 2 (TAT 6-24 HRS)  URINE CULTURE  POC URINE PREG, ED    EKG   Radiology No results found.  Procedures Procedures (including critical care time)  Medications Ordered in UC Medications - No data to display  Initial Impression / Assessment and Plan / UC Course  I have reviewed the triage vital signs and the nursing notes.  Pertinent labs & imaging results that were  available during my care of the patient were reviewed by me and considered in my medical decision making (see chart for details).     Ms. Seif is a 34 year old female is presenting with some left lower quadrant abdominal pain.  Urinalysis was showing nitrites.  We will send Bactrim.  Urine pregnancy was negative.  Abdominal exam is reassuring.  May have a component of constipation as well.  Counseled on MiraLAX.  Counseled on supportive care.  Give indications to follow-up return.  Final Clinical Impressions(s) / UC Diagnoses   Final diagnoses:  Left lower quadrant abdominal pain  Cystitis     Discharge Instructions     Rx Miralax for constipation - start with 1/2 cap daily and may increase to 1 cap daily to achieve 1-2 soft stools per day.  Continue MIralax for ~6 months to allow the rectum to return to normal caliber.   Increase fruits, veggies, and water in diet.   Supportive cares, return precautions, and emergency procedures reviewed.  We will call if the antibiotic needs to be changed by the urine culture  Please follow up if your symptoms fail to improve or worsen     ED Prescriptions    Medication Sig Dispense Auth. Provider   sulfamethoxazole-trimethoprim (BACTRIM DS) 800-160 MG tablet Take 1 tablet by mouth 2 (two) times daily. 6 tablet Myra Rude, MD     PDMP not reviewed this encounter.   Myra Rude, MD 04/27/20 2035

## 2020-04-27 NOTE — Discharge Instructions (Addendum)
Rx Miralax for constipation - start with 1/2 cap daily and may increase to 1 cap daily to achieve 1-2 soft stools per day.  Continue MIralax for ~6 months to allow the rectum to return to normal caliber.   Increase fruits, veggies, and water in diet.   Supportive cares, return precautions, and emergency procedures reviewed.  We will call if the antibiotic needs to be changed by the urine culture  Please follow up if your symptoms fail to improve or worsen

## 2020-04-28 LAB — SARS CORONAVIRUS 2 (TAT 6-24 HRS): SARS Coronavirus 2: NEGATIVE

## 2020-04-30 LAB — URINE CULTURE: Culture: >=100000 — AB

## 2020-07-01 ENCOUNTER — Telehealth: Payer: Self-pay | Admitting: Nurse Practitioner

## 2020-07-01 NOTE — Telephone Encounter (Signed)
Received a new hem referral from Dr Amado Nash for thrombocytopenia. Patricia Parrish has been cld and scheduled to see [name] on 7/26 at 145pm. Pt aware to arrive 15 minutes early.

## 2020-07-05 NOTE — Progress Notes (Addendum)
[facility]  Telephone:([phone]) [phone] [phone]  Clinic New consult Note   Patient Care Team: Lorre Munroe, NP as PCP - General (Internal Medicine) Amado Nash Candace Gallus, MD as Consulting Physician (Obstetrics and Gynecology) Malachy Mood, MD as Consulting Physician (Hematology) Date of Service: 07/06/2020   CHIEF COMPLAINTS/PURPOSE OF CONSULTATION:  Thrombocytopenia, referred by OB/GYN Rhoderick Moody, MD  HISTORY OF PRESENTING ILLNESS:  Patricia Parrish 34 y.o. female with a history of mild intermittent anemia on oral iron is here because of thrombocytopenia. She was found to have abnormal plt count of 71K at Ob/gyn on 06/26/2020. Platelet count has never been abnormal.  She denies bleeding except for menstrual bleeding, LMP 7/3 periods are irregular.  She is planning to have a pelvic ultrasound on 8/19.  For the past 9 years she develops right shoulder and arm pain, numbness, and weakness during her menstrual period then resolves to normal in the interim.  She has not been checked for B12 deficiency.  She has taken oral iron daily for years until recently.  Hep B/C status unknown.  HIV negative.  She recently completed treatment for BV.  She has chronic allergies and periodic sinusitis but did not have any flare when labs were checked.  Denies other chronic infections.  She does not know if she has any underlying liver disease.  He has not had any recent vaccines.  She takes NSAIDs 2/day as needed.   Socially, she lives with her mother, stepfather and 54 year old son who is healthy.  She works as an Chief Strategy Officer.  She is independent with ADLs.  He drinks occasional alcohol, denies tobacco or other drug use.  She has no family history of blood disorder or cancer.  Today she presents alone, she feels well in general.  Appetite fluctuates normally, weight is stable.  Denies bruising or bleeding except menstrual bleeding.  Has normal BM every 2 days.  Denies pain except  cramping in her C-section area with menses.  Denies recent fever, chills, cough, chest pain, dyspnea.  MEDICAL HISTORY:  No past medical history on file.  SURGICAL HISTORY: Past Surgical History:  Procedure Laterality Date  . CESAREAN SECTION      SOCIAL HISTORY: Social History   Socioeconomic History  . Marital status: Single    Spouse name: Not on file  . Number of children: Not on file  . Years of education: Not on file  . Highest education level: Not on file  Occupational History  . Not on file  Tobacco Use  . Smoking status: Never Smoker  . Smokeless tobacco: Never Used  Substance and Sexual Activity  . Alcohol use: Yes    Comment: occasional   . Drug use: No  . Sexual activity: Yes    Birth control/protection: None  Other Topics Concern  . Not on file  Social History Narrative  . Not on file   Social Determinants of Health   Financial Resource Strain:   . Difficulty of Paying Living Expenses:   Food Insecurity:   . Worried About Programme researcher, broadcasting/film/video in the Last Year:   . Barista in the Last Year:   Transportation Needs:   . Freight forwarder (Medical):   Marland Kitchen Lack of Transportation (Non-Medical):   Physical Activity:   . Days of Exercise per Week:   . Minutes of Exercise per Session:   Stress:   . Feeling of Stress :   Social Connections:   . Frequency  of Communication with Friends and Family:   . Frequency of Social Gatherings with Friends and Family:   . Attends Religious Services:   . Active Member of Clubs or Organizations:   . Attends BankerClub or Organization Meetings:   Marland Kitchen. Marital Status:   Intimate Partner Violence:   . Fear of Current or Ex-Partner:   . Emotionally Abused:   Marland Kitchen. Physically Abused:   . Sexually Abused:     FAMILY HISTORY: Family History  Problem Relation Age of Onset  . Hypertension Mother   . Hypertension Father   . Hypertension Maternal Grandmother   . Hypertension Maternal Grandfather   . Diabetes Paternal  Grandmother   . Diabetes Paternal Grandfather     ALLERGIES:  has No Known Allergies.  MEDICATIONS:  Current Outpatient Medications  Medication Sig Dispense Refill  . HYDROcodone-acetaminophen (NORCO) 7.5-325 MG tablet Take 1 tablet by mouth every 4 (four) hours as needed.    Marland Kitchen. amoxicillin (AMOXIL) 875 MG tablet SMARTSIG:1 Tablet(s) By Mouth Every 12 Hours (Patient not taking: Reported on 07/06/2020)    . salicylic acid 17 % gel Apply topically daily. Apply daily for the next 12 weeks. (Patient not taking: Reported on 07/06/2020) 15 g 0  . sulfamethoxazole-trimethoprim (BACTRIM DS) 800-160 MG tablet Take 1 tablet by mouth 2 (two) times daily. (Patient not taking: Reported on 07/06/2020) 6 tablet 0   No current facility-administered medications for this visit.    REVIEW OF SYSTEMS:   Constitutional: Denies fevers, chills or abnormal night sweats (+) appetite fluctuates Eyes: Denies blurriness of vision, double vision or watery eyes Ears, nose, mouth, throat, and face: Denies mucositis or sore throat Respiratory: Denies cough, dyspnea or wheezes Cardiovascular: Denies palpitation, chest discomfort or lower extremity swelling Gastrointestinal:  Denies nausea, vomiting, constipation, diarrhea, rectal bleeding heartburn or change in bowel habits Skin: Denies abnormal skin rashes Lymphatics: Denies new lymphadenopathy or easy bruising Neurological:Denies numbness, tingling or new weaknesses (+) right arm pain, numbness, weakness during menstrual periods, normal otherwise Behavioral/Psych: Mood is stable, no new changes  All other systems were reviewed with the patient and are negative.  PHYSICAL EXAMINATION: ECOG PERFORMANCE STATUS: 0 - Asymptomatic  Vitals:   07/06/20 1419  BP: 102/76  Pulse: 92  Resp: 16  Temp: 98.1 F (36.7 C)  SpO2: 100%   Filed Weights   07/06/20 1419  Weight: 125 lb 1.6 oz (56.7 kg)    GENERAL:alert, no distress and comfortable SKIN: No obvious bruising  to exposed skin EYES: sclera clear LUNGS:  normal breathing effort HEART:  no lower extremity edema ABDOMEN: soft, mild diffuse tenderness in low abdomen, more on right. No palpable liver or spleen.  PSYCH: alert & oriented x 3 with fluent speech NEURO: no focal motor/sensory deficits  LABORATORY DATA:  I have reviewed the data as listed CBC Latest Ref Rng & Units 07/06/2020 07/11/2018 01/25/2018  WBC 4.0 - 10.5 K/uL 3.5(L) 3.7 4.1  Hemoglobin 12.0 - 15.0 g/dL 11.8(L) 11.9(L) 11.5(L)  Hematocrit 36 - 46 % 37.3 36.0 35.7(L)  Platelets 150 - 400 K/uL 174 173 189.0    CMP Latest Ref Rng & Units 07/11/2018 01/25/2018 09/02/2008  Glucose 70 - 99 mg/dL 90 88 95  BUN 6 - 20 mg/dL 8 8 10   Creatinine 0.44 - 1.00 mg/dL 9.520.87 8.410.82 0.9  Sodium 324135 - 145 mmol/L 139 139 142  Potassium 3.5 - 5.1 mmol/L 3.7 3.7 3.7  Chloride 98 - 111 mmol/L 108 105 107  CO2 22 - 32  mmol/L 25 29 -  Calcium 8.9 - 10.3 mg/dL 7.0(J) 8.6 -  Total Protein 6.5 - 8.1 g/dL 7.5 7.0 -  Total Bilirubin 0.3 - 1.2 mg/dL 6.2(E) 0.7 -  Alkaline Phos 38 - 126 U/L 48 54 -  AST 15 - 41 U/L 12(L) 10 -  ALT 0 - 44 U/L 14 8 -     RADIOGRAPHIC STUDIES: I have personally reviewed the radiological images as listed and agreed with the findings in the report. No results found.  ASSESSMENT & PLAN: 33 yo female with   1. Isolated thrombocytopenia - We reviewed her outside medical record and labs in detail. She has intermittent mild anemia over many years. She only had thrombocytopenia once recently down to 71K. She denies bleeding except menstrual bleeding. We reviewed this could be lab error.  -We discussed other etiologies of thrombocytopenia, including chronic viral infection, nutritional deficiencies, liver/spleen disease, or autoimmune ITP.  -Repeat CBC today shows normal platelet count of 174K.  Rheumatoid factor, ANA, and sed rate are normal.  No evidence of hep B or C infection.  She is reportedly HIV negative. B12 and folic acid are  normal. -Given her normal platelet count, we are not recommending to proceed with imaging of the liver/spleen.  She does not need further work-up from our standpoint. -Continue follow-up with PCP and OB/GYN  2.  Right arm numbness during menstrual period -Etiology is unknown -B12 is normal.  Inflammatory markers are normal -She may benefit from neurology referral, will defer to PCP for this  Plan -Medical record and today's labs reviewed -Normal platelet count and work-up -Follow-up open at this point, return to PCP and OB/GYN  Orders Placed This Encounter  Procedures  . CBC with Differential (Cancer Center Only)    Standing Status:   Future    Number of Occurrences:   1    Standing Expiration Date:   07/06/2021  . Sedimentation rate    Standing Status:   Future    Number of Occurrences:   1    Standing Expiration Date:   07/06/2021  . Vitamin B12    Standing Status:   Future    Number of Occurrences:   1    Standing Expiration Date:   07/06/2021  . Folate, Serum    Standing Status:   Future    Number of Occurrences:   1    Standing Expiration Date:   07/06/2021  . Hepatitis C antibody    Standing Status:   Future    Number of Occurrences:   1    Standing Expiration Date:   07/06/2021  . Hepatitis B surface antigen    Standing Status:   Future    Number of Occurrences:   1    Standing Expiration Date:   07/06/2021  . Hepatitis B surface antibody    Standing Status:   Future    Number of Occurrences:   1    Standing Expiration Date:   07/06/2021  . Anti-ribonucleic acid antibody    Standing Status:   Future    Number of Occurrences:   1    Standing Expiration Date:   07/06/2021  . Rheumatoid factor    Standing Status:   Future    Number of Occurrences:   1    Standing Expiration Date:   07/06/2021   All questions were answered. The patient knows to call the clinic with any problems, questions or concerns.     Pollyann Samples,  NP 07/07/20   Addendum  I have seen the  patient, examined her. I agree with the assessment and and plan and have edited the notes.   I reviewed her previous lab results in the Epic and her recent CBC from her GYN office on 06/26/2020. She only had one abnormal platelet count on 06/26/20. We will repeat CBC today to see if she has persistent thrombocytopenia.  We discussed the, etiology of thrombocytopenia, including ITP, liver disease, B12 and or folic acid related cytopenias, chronic infection such as HBV, HCV, alcohol and medication related, etc. I will check B12, folate level, hepatitis B, hepatitis C, sedimentation rate, rheumatoid factor, ANA etc. If she does have thrombocytopenia on labs today, I will also obtain ultrasound of abdomen to evaluate her liver and spleen. If the above work-up were unremarkable, I will repeat her CBC, and if it shows normal platelet, we will see her as needed in future.   Malachy Mood  07/06/2020

## 2020-07-06 ENCOUNTER — Inpatient Hospital Stay: Payer: 59

## 2020-07-06 ENCOUNTER — Inpatient Hospital Stay: Payer: 59 | Attending: Nurse Practitioner | Admitting: Nurse Practitioner

## 2020-07-06 ENCOUNTER — Other Ambulatory Visit: Payer: Self-pay

## 2020-07-06 VITALS — BP 102/76 | HR 92 | Temp 98.1°F | Resp 16 | Ht 67.0 in | Wt 125.1 lb

## 2020-07-06 DIAGNOSIS — R531 Weakness: Secondary | ICD-10-CM | POA: Diagnosis not present

## 2020-07-06 DIAGNOSIS — Z8249 Family history of ischemic heart disease and other diseases of the circulatory system: Secondary | ICD-10-CM | POA: Insufficient documentation

## 2020-07-06 DIAGNOSIS — D649 Anemia, unspecified: Secondary | ICD-10-CM | POA: Insufficient documentation

## 2020-07-06 DIAGNOSIS — D696 Thrombocytopenia, unspecified: Secondary | ICD-10-CM | POA: Insufficient documentation

## 2020-07-06 DIAGNOSIS — M79601 Pain in right arm: Secondary | ICD-10-CM | POA: Diagnosis not present

## 2020-07-06 DIAGNOSIS — Z79899 Other long term (current) drug therapy: Secondary | ICD-10-CM | POA: Diagnosis not present

## 2020-07-06 DIAGNOSIS — R200 Anesthesia of skin: Secondary | ICD-10-CM | POA: Insufficient documentation

## 2020-07-06 DIAGNOSIS — Z833 Family history of diabetes mellitus: Secondary | ICD-10-CM | POA: Diagnosis not present

## 2020-07-06 LAB — CBC WITH DIFFERENTIAL (CANCER CENTER ONLY)
Abs Immature Granulocytes: 0 10*3/uL (ref 0.00–0.07)
Basophils Absolute: 0 10*3/uL (ref 0.0–0.1)
Basophils Relative: 1 %
Eosinophils Absolute: 0.1 10*3/uL (ref 0.0–0.5)
Eosinophils Relative: 3 %
HCT: 37.3 % (ref 36.0–46.0)
Hemoglobin: 11.8 g/dL — ABNORMAL LOW (ref 12.0–15.0)
Immature Granulocytes: 0 %
Lymphocytes Relative: 38 %
Lymphs Abs: 1.3 10*3/uL (ref 0.7–4.0)
MCH: 29.2 pg (ref 26.0–34.0)
MCHC: 31.6 g/dL (ref 30.0–36.0)
MCV: 92.3 fL (ref 80.0–100.0)
Monocytes Absolute: 0.4 10*3/uL (ref 0.1–1.0)
Monocytes Relative: 12 %
Neutro Abs: 1.6 10*3/uL — ABNORMAL LOW (ref 1.7–7.7)
Neutrophils Relative %: 46 %
Platelet Count: 174 10*3/uL (ref 150–400)
RBC: 4.04 MIL/uL (ref 3.87–5.11)
RDW: 12.8 % (ref 11.5–15.5)
WBC Count: 3.5 10*3/uL — ABNORMAL LOW (ref 4.0–10.5)
nRBC: 0 % (ref 0.0–0.2)

## 2020-07-06 LAB — VITAMIN B12: Vitamin B-12: 270 pg/mL (ref 180–914)

## 2020-07-06 LAB — FOLATE: Folate: 7.7 ng/mL (ref >5.9)

## 2020-07-06 LAB — HEPATITIS B SURFACE ANTIBODY,QUALITATIVE: Hep B S Ab: REACTIVE — AB

## 2020-07-06 LAB — SEDIMENTATION RATE: Sed Rate: 1 mm/hr (ref 0–22)

## 2020-07-06 LAB — HEPATITIS B SURFACE ANTIGEN: Hepatitis B Surface Ag: NONREACTIVE

## 2020-07-06 LAB — HEPATITIS C ANTIBODY: HCV Ab: NONREACTIVE

## 2020-07-07 LAB — RHEUMATOID FACTOR: Rheumatoid fact SerPl-aCnc: <10 IU/mL (ref 0.0–13.9)

## 2020-07-08 ENCOUNTER — Encounter: Payer: Self-pay | Admitting: Nurse Practitioner

## 2020-07-09 ENCOUNTER — Telehealth: Payer: Self-pay

## 2020-07-09 NOTE — Telephone Encounter (Signed)
Pt called made aware of all lab results and Np recommendations for follow up pt reminded to call [facility] if she has any other concerns or questions pt expresses understanding.

## 2020-07-09 NOTE — Telephone Encounter (Signed)
-----   Message from Pollyann Samples, NP sent at 07/08/2020  4:57 PM EDT ----- Please let her know platelet count was normal on Monday. B12, folic acid, hep B/C, ANA, RF, and sed rate all normal.  She can follow-up with PCP and OB/GYN.  Her low platelet count was probably a lab error.  We will see her as needed in the future.  Please fax my note to her referring doctor, Dr. Dawayne Patricia. Thanks, Clayborn Heron

## 2020-08-22 ENCOUNTER — Other Ambulatory Visit: Payer: Self-pay

## 2020-08-22 ENCOUNTER — Ambulatory Visit (HOSPITAL_COMMUNITY)
Admission: EM | Admit: 2020-08-22 | Discharge: 2020-08-22 | Disposition: A | Discharge status: Home / Self Care | Payer: Self-pay | Attending: Family Medicine | Admitting: Family Medicine

## 2020-08-22 ENCOUNTER — Encounter (HOSPITAL_COMMUNITY): Payer: Self-pay

## 2020-08-22 DIAGNOSIS — S61012A Laceration without foreign body of left thumb without damage to nail, initial encounter: Secondary | ICD-10-CM

## 2020-08-22 NOTE — Discharge Instructions (Signed)
Return for suture removal in 10 days. Keep clean and dry, return sooner for worsening pain, pus, redness and swelling to area

## 2020-08-22 NOTE — ED Provider Notes (Signed)
[facility]    CSN: [id number] Arrival date & time: 08/22/20  1005      History   Chief Complaint Chief Complaint  Patient presents with  . Laceration    HPI Patricia Parrish is a 34 y.o. female.   Patient presenting today for a left thumb laceration that occurred this morning while opening a box with a box cutter. Has been applying pressure, states wound is still bleeding when dressings removed. Has not yet taken anything for pain, states it is an 8/10 at this time. Able to move all 5 fingers, denies numbness or tingling, radiation of pain up arm, discoloration, N/V, syncope. Unsure of last tetanus vaccine, but is not interested in one today. Per PCP notes she also refused at prior CPE.      History reviewed. No pertinent past medical history.  There are no problems to display for this patient.   Past Surgical History:  Procedure Laterality Date  . CESAREAN SECTION      OB History   No obstetric history on file.      Home Medications    Prior to Admission medications   Medication Sig Start Date End Date Taking? Authorizing Provider  amoxicillin (AMOXIL) 875 MG tablet SMARTSIG:1 Tablet(s) By Mouth Every 12 Hours Patient not taking: Reported on 07/06/2020 01/20/20   [provider]  HYDROcodone-acetaminophen (NORCO) 7.5-325 MG tablet Take 1 tablet by mouth every 4 (four) hours as needed. 01/20/20   [provider]  salicylic acid 17 % gel Apply topically daily. Apply daily for the next 12 weeks. Patient not taking: Reported on 07/06/2020 07/31/19   Orvil Feil, PA-C  sulfamethoxazole-trimethoprim (BACTRIM DS) 800-160 MG tablet Take 1 tablet by mouth 2 (two) times daily. Patient not taking: Reported on 07/06/2020 04/27/20   Myra Rude, MD    Family History Family History  Problem Relation Age of Onset  . Hypertension Mother   . Hypertension Father   . Hypertension Maternal Grandmother   . Hypertension Maternal Grandfather   .  Diabetes Paternal Grandmother   . Diabetes Paternal Grandfather     Social History Social History   Tobacco Use  . Smoking status: Never Smoker  . Smokeless tobacco: Never Used  Substance Use Topics  . Alcohol use: Yes    Comment: occasional   . Drug use: No     Allergies   Patient has no known allergies.   Review of Systems Review of Systems PER HPI   Physical Exam Triage Vital Signs ED Triage Vitals [08/22/20 1027]  Enc Vitals Group     BP 115/68     Pulse Rate 76     Resp 16     Temp 98.3 F (36.8 C)     Temp Source Oral     SpO2 98 %     Weight 127 lb (57.6 kg)     Height 5\' 7"  (1.702 m)     Head Circumference      Peak Flow      Pain Score 8     Pain Loc      Pain Edu?      Excl. in GC?    No data found.  Updated Vital Signs BP 115/68   Pulse 76   Temp 98.3 F (36.8 C) (Oral)   Resp 16   Ht 5\' 7"  (1.702 m)   Wt 127 lb (57.6 kg)   SpO2 98%   BMI 19.89 kg/m   Visual Acuity  Right Eye Distance:   Left Eye Distance:   Bilateral Distance:    Right Eye Near:   Left Eye Near:    Bilateral Near:     Physical Exam Vitals and nursing note reviewed.  Constitutional:      Appearance: Normal appearance. She is not ill-appearing.  HENT:     Head: Atraumatic.  Eyes:     Extraocular Movements: Extraocular movements intact.     Conjunctiva/sclera: Conjunctivae normal.  Cardiovascular:     Rate and Rhythm: Normal rate and regular rhythm.     Pulses: Normal pulses.     Heart sounds: Normal heart sounds.  Pulmonary:     Effort: Pulmonary effort is normal.     Breath sounds: Normal breath sounds.  Musculoskeletal:        General: Normal range of motion.     Cervical back: Normal range of motion and neck supple.  Skin:    General: Skin is warm.     Comments: 1.5 cm well approximated laceration of medial left thumb, intermittent sanguinous discharge   Neurological:     Mental Status: She is alert and oriented to person, place, and time.      Comments: Thumb and left hand neurovascularly intact   Psychiatric:        Mood and Affect: Mood normal.        Thought Content: Thought content normal.        Judgment: Judgment normal.      UC Treatments / Results  Labs (all labs ordered are listed, but only abnormal results are displayed) Labs Reviewed - No data to display  EKG   Radiology No results found.  Procedures Laceration Repair  Date/Time: 08/22/2020 8:08 PM Performed by: Particia Nearing, PA-C Authorized by: Particia Nearing, PA-C   Consent:    Consent obtained:  Verbal   Consent given by:  Patient   Risks discussed:  Pain, infection, poor wound healing, poor cosmetic result and nerve damage   Alternatives discussed: pressure dressing and steri strips. Anesthesia (see MAR for exact dosages):    Anesthesia method:  Local infiltration   Local anesthetic:  Lidocaine 2% w/o epi Laceration details:    Location:  Finger   Finger location:  L thumb   Length (cm):  1.5   Depth (mm):  2 Pre-procedure details:    Preparation:  Patient was prepped and draped in usual sterile fashion Exploration:    Hemostasis achieved with:  Direct pressure   Wound exploration: wound explored through full range of motion     Contaminated: no   Treatment:    Area cleansed with:  Betadine   Amount of cleaning:  Standard   Irrigation solution:  Sterile saline   Irrigation method:  Syringe   Visualized foreign bodies/material removed: no   Skin repair:    Repair method:  Sutures   Suture size:  3-0   Suture material:  Prolene   Suture technique:  Simple interrupted   Number of sutures:  2 Approximation:    Approximation:  Close Post-procedure details:    Dressing:  Non-adherent dressing   Patient tolerance of procedure:  Tolerated well, no immediate complications   (including critical care time)  Medications Ordered in UC Medications - No data to display  Initial Impression / Assessment and Plan / UC  Course  I have reviewed the triage vital signs and the nursing notes.  Pertinent labs & imaging results that were available during my care of the  patient were reviewed by me and considered in my medical decision making (see chart for details).     Wound edges well approximated, closed with suture without immediate complication noted. Discussed home wound care, return for suture removal in 10 days. Discussed warning signs for possible infection and to return sooner for these. OTC pain relievers prn for pain. Declines tdap today, risks reviewed. Work note given for light duty until sutures able to be removed.  Final Clinical Impressions(s) / UC Diagnoses   Final diagnoses:  Laceration of left thumb without foreign body without damage to nail, initial encounter     Discharge Instructions     Return for suture removal in 10 days. Keep clean and dry, return sooner for worsening pain, pus, redness and swelling to area    ED Prescriptions    None     PDMP not reviewed this encounter.   Particia Nearing, [place] 08/22/20 2013

## 2020-08-22 NOTE — ED Triage Notes (Signed)
Pt states she cut her left 1st digit of hand with a box cutter today. PT has a bandage on the lac. Bleeding is controlled.

## 2020-08-31 ENCOUNTER — Ambulatory Visit (HOSPITAL_COMMUNITY)
Admission: EM | Admit: 2020-08-31 | Discharge: 2020-08-31 | Disposition: A | Discharge status: Home / Self Care | Payer: 59

## 2020-08-31 ENCOUNTER — Other Ambulatory Visit: Payer: Self-pay

## 2020-08-31 DIAGNOSIS — Z4802 Encounter for removal of sutures: Secondary | ICD-10-CM

## 2020-08-31 NOTE — ED Triage Notes (Signed)
Pt presents for suture removal. 2 sutures with placed on 08/22/20. Denies any pain at sight. Wound is clean with no drainage.

## 2020-12-18 ENCOUNTER — Other Ambulatory Visit: Payer: 59

## 2020-12-18 DIAGNOSIS — Z20822 Contact with and (suspected) exposure to covid-19: Secondary | ICD-10-CM

## 2020-12-21 LAB — NOVEL CORONAVIRUS, NAA: SARS-CoV-2, NAA: DETECTED — AB

## 2021-06-06 ENCOUNTER — Other Ambulatory Visit: Payer: Self-pay

## 2021-06-06 ENCOUNTER — Emergency Department
Admission: EM | Admit: 2021-06-06 | Discharge: 2021-06-06 | Disposition: A | Discharge status: Home / Self Care | Payer: 59 | Attending: Student in an Organized Health Care Education/Training Program | Admitting: Student in an Organized Health Care Education/Training Program

## 2021-06-06 DIAGNOSIS — B9689 Other specified bacterial agents as the cause of diseases classified elsewhere: Secondary | ICD-10-CM | POA: Diagnosis not present

## 2021-06-06 DIAGNOSIS — R350 Frequency of micturition: Secondary | ICD-10-CM | POA: Diagnosis present

## 2021-06-06 DIAGNOSIS — N3001 Acute cystitis with hematuria: Secondary | ICD-10-CM | POA: Insufficient documentation

## 2021-06-06 LAB — URINALYSIS, COMPLETE (UACMP) WITH MICROSCOPIC
Bilirubin Urine: NEGATIVE
Glucose, UA: NEGATIVE mg/dL
Ketones, ur: NEGATIVE mg/dL
Leukocytes,Ua: NEGATIVE
Nitrite: NEGATIVE
Protein, ur: NEGATIVE mg/dL
Specific Gravity, Urine: 1.027 (ref 1.005–1.030)
pH: 5 (ref 5.0–8.0)

## 2021-06-06 LAB — POC URINE PREG, ED: Preg Test, Ur: NEGATIVE

## 2021-06-06 MED ORDER — CEPHALEXIN 500 MG PO CAPS: 500.0000 mg | ORAL_CAPSULE | Freq: Four times a day (QID) | ORAL | 0 refills | Status: AC

## 2021-06-06 NOTE — Discharge Instructions (Addendum)
Take Keflex four times daily for the next seven days.  

## 2021-06-06 NOTE — ED Triage Notes (Signed)
Pt states urinary frequency x 1.5 months. States urine smells foul. Denies fever. A&O, ambulatory. Denies discharge or STI concern.

## 2021-06-06 NOTE — ED Provider Notes (Signed)
Emergency Medicine Provider Triage Evaluation Note  Patricia Parrish , a 35 y.o. female  was evaluated in triage.  Pt complains of urinary frequency and foul smell to urine x 1.5 months. Denies abdominal pain, nausea, vomiting or diarrhea. Denies concern for STIs. Reports back pain yesterday, none today. Denies known fevers..  Review of Systems  Positive: Dysuria, foul smell to urine Negative: Fever, abdominal pain, nausea, vomiting  Physical Exam  BP 119/83 (BP Location: Left Arm)   Pulse 92   Temp 98.7 F (37.1 C) (Oral)   Resp 16   Ht 5\' 7"  (1.702 m)   Wt 57.6 kg   SpO2 100%   BMI 19.89 kg/m  Gen:   Awake, no distress   Resp:  Normal effort  MSK:   Moves extremities without difficulty  Other:  Abd: nontender, no CVA tenderness  Medical Decision Making  Medically screening exam initiated at 6:12 PM.  Appropriate orders placed.  Patricia Parrish was informed that the remainder of the evaluation will be completed by another provider, this initial triage assessment does not replace that evaluation, and the importance of remaining in the ED until their evaluation is complete.     Talbert Nan, PA 06/06/21 1813    06/08/21, MD 06/06/21 [phone]

## 2021-06-21 NOTE — ED Provider Notes (Signed)
[facility]  ____________________________________________  Time seen: Approximately 2:59 PM  I have reviewed the triage vital signs and the nursing notes.   HISTORY  Chief Complaint Urinary Frequency   Historian Patient     HPI Patricia Parrish is a 35 y.o. female presents to the emergency department with increased urinary frequency for the past 4 to 5 weeks.  Patient has had some mild dysuria.  No hematuria.  No fever at home.  Denies flank pain, abdominal pain, nausea, vomiting or fever at home.  Denies deep dyspareunia.  No concerns for STDs.  No other alleviating measures have been attempted.   History reviewed. No pertinent past medical history.   Immunizations up to date:  Yes.     History reviewed. No pertinent past medical history.  There are no problems to display for this patient.   Past Surgical History:  Procedure Laterality Date   CESAREAN SECTION      Prior to Admission medications   Medication Sig Start Date End Date Taking? Authorizing Provider  amoxicillin (AMOXIL) 875 MG tablet SMARTSIG:1 Tablet(s) By Mouth Every 12 Hours Patient not taking: Reported on 07/06/2020 01/20/20   [provider]  HYDROcodone-acetaminophen (NORCO) 7.5-325 MG tablet Take 1 tablet by mouth every 4 (four) hours as needed. 01/20/20   [provider]  salicylic acid 17 % gel Apply topically daily. Apply daily for the next 12 weeks. Patient not taking: Reported on 07/06/2020 07/31/19   Orvil Feil, PA-C  sulfamethoxazole-trimethoprim (BACTRIM DS) 800-160 MG tablet Take 1 tablet by mouth 2 (two) times daily. Patient not taking: Reported on 07/06/2020 04/27/20   Myra Rude, MD    Allergies Patient has no known allergies.  Family History  Problem Relation Age of Onset   Hypertension Mother    Hypertension Father    Hypertension Maternal Grandmother    Hypertension Maternal Grandfather    Diabetes Paternal Grandmother    Diabetes  Paternal Grandfather     Social History Social History   Tobacco Use   Smoking status: Never   Smokeless tobacco: Never  Substance Use Topics   Alcohol use: Yes    Comment: occasional    Drug use: No     Review of Systems  Constitutional: No fever/chills Eyes:  No discharge ENT: No upper respiratory complaints. Respiratory: no cough. No SOB/ use of accessory muscles to breath Gastrointestinal:   No nausea, no vomiting.  No diarrhea.  No constipation. Genitourinary: Patient has increased urinary frequency and mild dysuria.  Musculoskeletal: Negative for musculoskeletal pain. Skin: Negative for rash, abrasions, lacerations, ecchymosis.    ____________________________________________   PHYSICAL EXAM:  VITAL SIGNS: ED Triage Vitals [06/06/21 1811]  Enc Vitals Group     BP 119/83     Pulse Rate 92     Resp 16     Temp 98.7 F (37.1 C)     Temp Source Oral     SpO2 100 %     Weight 127 lb (57.6 kg)     Height 5\' 7"  (1.702 m)     Head Circumference      Peak Flow      Pain Score 0     Pain Loc      Pain Edu?      Excl. in GC?      Constitutional: Alert and oriented. Well appearing and in no acute distress. Eyes: Conjunctivae are normal. PERRL. EOMI. Head: Atraumatic. ENT:      Nose: No congestion/rhinnorhea.  Mouth/Throat: Mucous membranes are moist.  Neck: No stridor.  No cervical spine tenderness to palpation. Cardiovascular: Normal rate, regular rhythm. Normal S1 and S2.  Good peripheral circulation. Respiratory: Normal respiratory effort without tachypnea or retractions. Lungs CTAB. Good air entry to the bases with no decreased or absent breath sounds Gastrointestinal: Bowel sounds x 4 quadrants. Soft and nontender to palpation. No guarding or rigidity. No distention. Musculoskeletal: Full range of motion to all extremities. No obvious deformities noted Neurologic:  Normal for age. No gross focal neurologic deficits are appreciated.  Skin:  Skin is  warm, dry and intact. No rash noted. Psychiatric: Mood and affect are normal for age. Speech and behavior are normal.   ____________________________________________   LABS (all labs ordered are listed, but only abnormal results are displayed)  Labs Reviewed  URINALYSIS, COMPLETE (UACMP) WITH MICROSCOPIC - Abnormal; Notable for the following components:      Result Value   Color, Urine AMBER (*)    APPearance CLOUDY (*)    Hgb urine dipstick SMALL (*)    Bacteria, UA MANY (*)    All other components within normal limits  POC URINE PREG, ED   ____________________________________________  EKG   ____________________________________________  RADIOLOGY   No results found.  ____________________________________________    PROCEDURES  Procedure(s) performed:     Procedures     Medications - No data to display   ____________________________________________   INITIAL IMPRESSION / ASSESSMENT AND PLAN / ED COURSE  Pertinent labs & imaging results that were available during my care of the patient were reviewed by me and considered in my medical decision making (see chart for details).      Assessment and plan Increased urinary frequency Dysuria 35 year old female presents to the emergency department with dysuria and increased urinary frequency for the past 4 weeks.  Urinalysis concerning for UTI with many bacteria and a small amount of blood.  Patient was discharged with Keflex.  No flank pain, vomiting, fever or abdominal pain to suggest pyelonephritis.  Return precautions were given to return with new or worsening symptoms.  All patient questions were answered.     ____________________________________________  FINAL CLINICAL IMPRESSION(S) / ED DIAGNOSES  Final diagnoses:  Acute cystitis with hematuria      NEW MEDICATIONS STARTED DURING THIS VISIT:  ED Discharge Orders          Ordered    cephALEXin (KEFLEX) 500 MG capsule  4 times daily         06/06/21 1952                This chart was dictated using voice recognition software/Dragon. Despite best efforts to proofread, errors can occur which can change the meaning. Any change was purely unintentional.     Orvil Feil, PA-C 06/21/21 1502    Sharman Cheek, MD 06/21/21 2357

## 2021-07-07 ENCOUNTER — Other Ambulatory Visit: Payer: Self-pay

## 2021-07-07 ENCOUNTER — Ambulatory Visit: Payer: Self-pay | Admitting: Family Medicine

## 2021-07-07 ENCOUNTER — Encounter: Payer: Self-pay | Admitting: Family Medicine

## 2021-07-07 DIAGNOSIS — Z113 Encounter for screening for infections with a predominantly sexual mode of transmission: Secondary | ICD-10-CM

## 2021-07-07 DIAGNOSIS — N760 Acute vaginitis: Secondary | ICD-10-CM

## 2021-07-07 DIAGNOSIS — B9689 Other specified bacterial agents as the cause of diseases classified elsewhere: Secondary | ICD-10-CM

## 2021-07-07 LAB — WET PREP FOR TRICH, YEAST, CLUE
Trichomonas Exam: NEGATIVE
Yeast Exam: NEGATIVE

## 2021-07-07 MED ORDER — METRONIDAZOLE 500 MG PO TABS: 500.0000 mg | ORAL_TABLET | Freq: Two times a day (BID) | ORAL | 0 refills | Status: AC

## 2021-07-07 NOTE — Progress Notes (Signed)
[facility] Department STI clinic/screening visit  Subjective:  Patricia Parrish is a 35 y.o. female being seen today for an STI screening visit. The patient reports they do have symptoms.  Patient reports that they do desire a pregnancy in the next year.   They reported they are not interested in discussing contraception today.  Patient's last menstrual period was 06/29/2021 (approximate).   Patient has the following medical conditions:  There are no problems to display for this patient.   Chief Complaint  Patient presents with   SEXUALLY TRANSMITTED DISEASE    Screening     HPI  Patient reports here for screening, report s/sx   Last HIV test per patient/review of record was a few years ago.   Patient reports last pap was 03/02/2018  See flowsheet for further details and programmatic requirements.    The following portions of the patient's history were reviewed and updated as appropriate: allergies, current medications, past medical history, past social history, past surgical history and problem list.  Objective:  There were no vitals filed for this visit.  Physical Exam Vitals and nursing note reviewed.  Constitutional:      Appearance: Normal appearance.  HENT:     Head: Normocephalic and atraumatic.     Mouth/Throat:     Mouth: Mucous membranes are moist.     Pharynx: Oropharynx is clear. No oropharyngeal exudate or posterior oropharyngeal erythema.  Pulmonary:     Effort: Pulmonary effort is normal.  Chest:  Breasts:    Right: No axillary adenopathy or supraclavicular adenopathy.     Left: No axillary adenopathy or supraclavicular adenopathy.  Abdominal:     General: Abdomen is flat.     Palpations: There is no mass.     Tenderness: There is no abdominal tenderness. There is no rebound.  Genitourinary:    General: Normal vulva.     Exam position: Lithotomy position.     Pubic Area: No rash or pubic lice.      Labia:        Right: No rash or  lesion.        Left: No rash or lesion.      Vagina: Normal. No vaginal discharge, erythema, bleeding or lesions.     Cervix: No cervical motion tenderness, discharge, friability, lesion or erythema.     Uterus: Normal.      Adnexa: Right adnexa normal and left adnexa normal.     Rectum: Normal.     Comments: External genitalia without, lice, nits, erythema, edema , lesions or inguinal adenopathy. Vagina with normal mucosa and watery discharge and pH equals 4.  Cervix without visual lesions, uterus firm, mobile, non-tender, no masses, CMT adnexal fullness or tenderness.   Lymphadenopathy:     Head:     Right side of head: No preauricular or posterior auricular adenopathy.     Left side of head: No preauricular or posterior auricular adenopathy.     Cervical: No cervical adenopathy.     Upper Body:     Right upper body: No supraclavicular or axillary adenopathy.     Left upper body: No supraclavicular or axillary adenopathy.     Lower Body: No right inguinal adenopathy. No left inguinal adenopathy.  Skin:    General: Skin is warm and dry.     Findings: No rash.  Neurological:     Mental Status: She is alert and oriented to person, place, and time.     Assessment and Plan:  Patricia Parrish is a 35 y.o. female presenting to the [facility] Department for STI screening  1. Screening examination for venereal disease  - Chlamydia/Gonorrhea [place] [facility] - HIV [place] [facility] - Syphilis Serology, [place] [facility] - WET PREP FOR TRICH, YEAST, CLUE Patient accepted all screenings including wet prep, vaginal CT/GC and bloodwork for HIV/RPR.  Patient meets criteria for HepB screening? No. Ordered? No - does not meet criteria  Patient meets criteria for HepC screening? No. Ordered? No - does not meet criteria   2. BV (bacterial vaginosis)  Wet prep results +amine, + clue  Treatment needed for BV  Discussed time line for [facility] results and that patient will be called with positive  results and encouraged patient to call if she had not heard in 2 weeks.  Counseled to return or seek care for continued or worsening symptoms Recommended condom use with all sex  Patient is currently using  no BCM  to prevent pregnancy.  Pt desires pregnancy.      Return for as needed.  No future appointments.  Wendi Snipes, FNP

## 2021-07-07 NOTE — Progress Notes (Signed)
Pt here for STD screening.  Wet mount results reviewed with Provider.  Medication dispensed per Provider orders. Perl Folmar M Lucan Riner, RN  

## 2021-09-12 ENCOUNTER — Emergency Department: Payer: 59

## 2021-09-12 ENCOUNTER — Other Ambulatory Visit: Payer: Self-pay

## 2021-09-12 ENCOUNTER — Encounter: Payer: Self-pay | Admitting: Emergency Medicine

## 2021-09-12 ENCOUNTER — Emergency Department
Admission: EM | Admit: 2021-09-12 | Discharge: 2021-09-12 | Disposition: A | Discharge status: Home / Self Care | Payer: 59 | Attending: Emergency Medicine | Admitting: Emergency Medicine

## 2021-09-12 DIAGNOSIS — M25512 Pain in left shoulder: Secondary | ICD-10-CM | POA: Insufficient documentation

## 2021-09-12 MED ORDER — METHOCARBAMOL 500 MG PO TABS: 500.0000 mg | ORAL_TABLET | Freq: Three times a day (TID) | ORAL | 0 refills | Status: AC | PRN

## 2021-09-12 MED ORDER — MELOXICAM 15 MG PO TABS
15.0000 mg | ORAL_TABLET | Freq: Every day | ORAL | 2 refills | Status: DC
Start: 2021-09-12 — End: 2022-02-02

## 2021-09-12 NOTE — Discharge Instructions (Signed)
You can take meloxicam and Robaxin as needed for muscle spasms.

## 2021-09-12 NOTE — ED Provider Notes (Signed)
Emergency Medicine Provider Triage Evaluation Note  Patricia Parrish , a 35 y.o. female  was evaluated in triage.  Pt complains of left shoulder pain.  This started 1 week ago.  She describes the pain as achy and stiff.  The pain radiates into her neck.  She denies numbness, tingling or weakness of her left upper extremity.  She denies injury.  Review of Systems  Positive: Left shoulder pain Negative: Headache, dizziness, numbness, tingling, weakness of her left upper extremity  Physical Exam  BP (!) 131/97   Pulse 87   Temp 98 F (36.7 C)   Resp 18   Ht 5\' 7"  (1.702 m)   Wt 59 kg   LMP 09/02/2021 (Approximate)   SpO2 100%   BMI 20.36 kg/m  Gen:   Awake, no distress   Resp:  Normal effort  MSK:   Decreased internal and external rotation of left shoulder secondary to pain.   Medical Decision Making  Medically screening exam initiated at 3:33 PM.  Appropriate orders placed.  Patricia Parrish was informed that the remainder of the evaluation will be completed by another provider, this initial triage assessment does not replace that evaluation, and the importance of remaining in the ED until their evaluation is complete.     Talbert Nan, NP 09/12/21 1535    11/12/21, MD 09/12/21 [phone]

## 2021-09-12 NOTE — ED Notes (Signed)
Patient given discharge instructions, all questions answered. Patient in possession of all belongings, directed to the discharge area  

## 2021-09-12 NOTE — ED Triage Notes (Signed)
Pt reports pain to her left shoulder for the last week. Pt states no injuries and hurts worse in the AM.

## 2021-09-27 NOTE — ED Provider Notes (Signed)
[facility]-EMERGENCY DEPARTMENT  ____________________________________________  Time seen: Approximately 5:02 PM  I have reviewed the triage vital signs and the nursing notes.   HISTORY  Chief Complaint Shoulder Pain   Historian Patient     HPI Patricia Parrish is a 35 y.o. female presents to the emergency department with nontraumatic left shoulder pain.  No chest pain, chest tightness, shortness of breath or abdominal pain.   History reviewed. No pertinent past medical history.   Immunizations up to date:  Yes.     History reviewed. No pertinent past medical history.  There are no problems to display for this patient.   Past Surgical History:  Procedure Laterality Date   CESAREAN SECTION      Prior to Admission medications   Medication Sig Start Date End Date Taking? Authorizing Provider  meloxicam (MOBIC) 15 MG tablet Take 1 tablet (15 mg total) by mouth daily. 09/12/21 09/12/22 Yes Pia Mau M, PA-C  amoxicillin (AMOXIL) 875 MG tablet SMARTSIG:1 Tablet(s) By Mouth Every 12 Hours Patient not taking: Reported on 07/06/2020 01/20/20   [provider]  HYDROcodone-acetaminophen (NORCO) 7.5-325 MG tablet Take 1 tablet by mouth every 4 (four) hours as needed. 01/20/20   [provider]  salicylic acid 17 % gel Apply topically daily. Apply daily for the next 12 weeks. Patient not taking: Reported on 07/06/2020 07/31/19   Orvil Feil, PA-C  sulfamethoxazole-trimethoprim (BACTRIM DS) 800-160 MG tablet Take 1 tablet by mouth 2 (two) times daily. Patient not taking: Reported on 07/06/2020 04/27/20   Myra Rude, MD    Allergies Patient has no known allergies.  Family History  Problem Relation Age of Onset   Hypertension Mother    Hypertension Father    Hypertension Maternal Grandmother    Hypertension Maternal Grandfather    Diabetes Paternal Grandmother    Diabetes Paternal Grandfather     Social History Social History   Tobacco Use    Smoking status: Never   Smokeless tobacco: Never  Vaping Use   Vaping Use: Never used  Substance Use Topics   Alcohol use: Yes    Comment: occasional    Drug use: No     Review of Systems  Constitutional: No fever/chills Eyes:  No discharge ENT: No upper respiratory complaints. Respiratory: no cough. No SOB/ use of accessory muscles to breath Gastrointestinal:   No nausea, no vomiting.  No diarrhea.  No constipation. Musculoskeletal: Patient has left shoulder pain.  Skin: Negative for rash, abrasions, lacerations, ecchymosis.    ____________________________________________   PHYSICAL EXAM:  VITAL SIGNS: ED Triage Vitals  Enc Vitals Group     BP 09/12/21 1531 (!) 131/97     Pulse Rate 09/12/21 1531 87     Resp 09/12/21 1531 18     Temp 09/12/21 1531 98 F (36.7 C)     Temp src --      SpO2 09/12/21 1531 100 %     Weight 09/12/21 1530 130 lb (59 kg)     Height 09/12/21 1530 5\' 7"  (1.702 m)     Head Circumference --      Peak Flow --      Pain Score 09/12/21 1529 9     Pain Loc --      Pain Edu? --      Excl. in [place]? --      Constitutional: Alert and oriented. Well appearing and in no acute distress. Eyes: Conjunctivae are normal. PERRL. EOMI. Head: Atraumatic. ENT:  Nose: No congestion/rhinnorhea.      Mouth/Throat: Mucous membranes are moist.  Neck: No stridor.  No cervical spine tenderness to palpation. Cardiovascular: Normal rate, regular rhythm. Normal S1 and S2.  Good peripheral circulation. Respiratory: Normal respiratory effort without tachypnea or retractions. Lungs CTAB. Good air entry to the bases with no decreased or absent breath sounds Gastrointestinal: Bowel sounds x 4 quadrants. Soft and nontender to palpation. No guarding or rigidity. No distention. Musculoskeletal: Full range of motion to all extremities. No obvious deformities noted Neurologic:  Normal for age. No gross focal neurologic deficits are appreciated.  Skin:  Skin is warm, dry  and intact. No rash noted. Psychiatric: Mood and affect are normal for age. Speech and behavior are normal.   ____________________________________________   LABS (all labs ordered are listed, but only abnormal results are displayed)  Labs Reviewed - No data to display ____________________________________________  EKG   ____________________________________________  RADIOLOGY Geraldo Pitter, personally viewed and evaluated these images (plain radiographs) as part of my medical decision making, as well as reviewing the written report by the radiologist.  No bony abnormality of the left shoulder.  No results found.  ____________________________________________    PROCEDURES  Procedure(s) performed:     Procedures     Medications - No data to display   ____________________________________________   INITIAL IMPRESSION / ASSESSMENT AND PLAN / ED COURSE  Pertinent labs & imaging results that were available during my care of the patient were reviewed by me and considered in my medical decision making (see chart for details).      Assessment and plan Left shoulder pain 35 year old female presents to the emergency department with acute nontraumatic left shoulder pain.  No bony abnormality was visualized on x-ray of the left shoulder.  Patient was discharged with meloxicam and Robaxin.  Return precautions were given to return with new or worsening symptoms.     ____________________________________________  FINAL CLINICAL IMPRESSION(S) / ED DIAGNOSES  Final diagnoses:  Acute pain of left shoulder      NEW MEDICATIONS STARTED DURING THIS VISIT:  ED Discharge Orders          Ordered    meloxicam (MOBIC) 15 MG tablet  Daily        09/12/21 1827    methocarbamol (ROBAXIN) 500 MG tablet  Every 8 hours PRN        09/12/21 1827                This chart was dictated using voice recognition software/Dragon. Despite best efforts to proofread, errors  can occur which can change the meaning. Any change was purely unintentional.     Orvil Feil, PA-C 09/27/21 1819    Jene Every, MD 09/27/21 Silva Bandy

## 2021-11-08 ENCOUNTER — Ambulatory Visit: Payer: Self-pay

## 2021-11-23 ENCOUNTER — Other Ambulatory Visit: Payer: Self-pay

## 2021-11-23 ENCOUNTER — Encounter: Payer: Self-pay | Admitting: Family Medicine

## 2021-11-23 ENCOUNTER — Ambulatory Visit (LOCAL_COMMUNITY_HEALTH_CENTER): Payer: Self-pay | Admitting: Family Medicine

## 2021-11-23 VITALS — BP 107/71 | Ht 67.0 in | Wt 126.2 lb

## 2021-11-23 DIAGNOSIS — Z1272 Encounter for screening for malignant neoplasm of vagina: Secondary | ICD-10-CM

## 2021-11-23 DIAGNOSIS — Z3009 Encounter for other general counseling and advice on contraception: Secondary | ICD-10-CM

## 2021-11-23 DIAGNOSIS — Z01419 Encounter for gynecological examination (general) (routine) without abnormal findings: Secondary | ICD-10-CM

## 2021-11-23 DIAGNOSIS — Z3202 Encounter for pregnancy test, result negative: Secondary | ICD-10-CM

## 2021-11-23 DIAGNOSIS — Z113 Encounter for screening for infections with a predominantly sexual mode of transmission: Secondary | ICD-10-CM

## 2021-11-23 LAB — WET PREP FOR TRICH, YEAST, CLUE
Trichomonas Exam: NEGATIVE
Yeast Exam: NEGATIVE

## 2021-11-23 LAB — HM HIV SCREENING LAB: HM HIV Screening: NEGATIVE

## 2021-11-23 LAB — PREGNANCY, URINE: Preg Test, Ur: NEGATIVE

## 2021-11-23 NOTE — Progress Notes (Signed)
Pt here for PE and PT.  Wet prep results reviewed, no treatment required.  Pt notified of negative PT. Berdie Ogren, RN

## 2021-11-25 LAB — IGP, APTIMA HPV
HPV Aptima: NEGATIVE
PAP Smear Comment: 0

## 2021-11-28 NOTE — Progress Notes (Signed)
[facility] DEPARTMENT [facility] [address] Main Number: [phone]    Family Planning Visit- Initial Visit  Subjective:  Patricia Parrish is a 35 y.o.  G1P0   being seen today for an initial annual visit and to discuss reproductive life planning.  The patient is currently using No Method - No Contraceptive Precautions for pregnancy prevention. Patient reports   does not know want a pregnancy in the next year.  Patient has the following medical conditions does not have a problem list on file.  Chief Complaint  Patient presents with   Annual Exam    PT    Patient reports here for physical, STI screening and PT   Patient denies any problems    Body mass index is 19.77 kg/m. - Patient is eligible for diabetes screening based on BMI and age >53?  not applicable HA1C ordered? not applicable  Patient reports 2  partner/s in last year. Desires STI screening?  Yes  Has patient been screened once for HCV in the past?  Yes   Lab Results  Component Value Date   HCVAB NON REACTIVE 07/06/2020    Does the patient have current drug use (including MJ), have a partner with drug use, and/or has been incarcerated since last result? No  If yes-- Screen for HCV through [facility]   Does the patient meet criteria for HBV testing? Yes  Criteria:  -Household, sexual or needle sharing contact with HBV -History of drug use -HIV positive -Those with known Hep C   Health Maintenance Due  Topic Date Due   COVID-19 Vaccine (1) Never done   HIV Screening  Never done   TETANUS/TDAP  Never done   INFLUENZA VACCINE  Never done    Review of Systems  All other systems reviewed and are negative.  The following portions of the patient's history were reviewed and updated as appropriate: allergies, current medications, past family history, past medical history, past social history, past surgical history and problem list. Problem list updated.   See  flowsheet for other program required questions.  Objective:   Vitals:   11/23/21 1446  BP: 107/71  Weight: 126 lb 3.2 oz (57.2 kg)  Height: 5\' 7"  (1.702 m)    Physical Exam Vitals and nursing note reviewed.  Constitutional:      Appearance: Normal appearance.  HENT:     Head: Normocephalic and atraumatic.     Mouth/Throat:     Mouth: Mucous membranes are moist.     Dentition: Normal dentition.     Pharynx: No oropharyngeal exudate or posterior oropharyngeal erythema.  Eyes:     General: No scleral icterus. Neck:     Thyroid: No thyroid mass, thyromegaly or thyroid tenderness.  Cardiovascular:     Rate and Rhythm: Normal rate.     Pulses: Normal pulses.  Pulmonary:     Effort: Pulmonary effort is normal.  Abdominal:     General: Abdomen is flat. Bowel sounds are normal.     Palpations: Abdomen is soft.  Genitourinary:    General: Normal vulva.     Rectum: Normal.     Comments: External genitalia without, lice, nits, erythema, edema , lesions or inguinal adenopathy. Vagina with normal mucosa and discharge and pH equals 4.  Cervix without visual lesions, uterus firm, mobile, non-tender, no masses, CMT adnexal fullness or tenderness.   Musculoskeletal:        General: Normal range of motion.  Skin:  General: Skin is warm and dry.  Neurological:     General: No focal deficit present.     Mental Status: She is alert.      Assessment and Plan:  Patricia Parrish is a 35 y.o. female presenting to the [facility] Department for an initial annual wellness/contraceptive visit  Contraception counseling: Reviewed all forms of birth control options in the tiered based approach. available including abstinence; over the counter/barrier methods; hormonal contraceptive medication including pill, patch, ring, injection,contraceptive implant, ECP; hormonal and nonhormonal IUDs; permanent sterilization options including vasectomy and the various tubal sterilization  modalities. Risks, benefits, and typical effectiveness rates were reviewed.  Questions were answered.  Written information was also given to the patient to review.  Patient desires No Method - No Contraceptive Precautions, this was prescribed for patient.    The patient will follow up in  as needed  for surveillance.  The patient was told to call with any further questions, or with any concerns about this method of contraception.  Emphasized use of condoms 100% of the time for STI prevention.  Pt also declined ECP.   1. Smear, vaginal, as part of routine gynecological examination Well woman exam Pap today  CBE today   - Pregnancy, urine - IGP, Aptima HPV  2. Screening examination for venereal disease Patient accepted all screenings including wet prep, oral, vaginal CT/GC and bloodwork for HIV/RPR.  Patient meets criteria for HepB screening? No. Ordered? No - does not meet criteria  Patient meets criteria for HepC screening? No. Ordered? No - does not meet criteria   Wet prep results neg    No Treatment needed  Discussed time line for [facility] results and that patient will be called with positive results and encouraged patient to call if she had not heard in 2 weeks.  Counseled to return or seek care for continued or worsening symptoms Recommended condom use with all sex  Patient is currently using  No BCM   to prevent pregnancy.  - HIV [facility] - Syphilis Serology, [facility] - Chlamydia/Gonorrhea [facility] - WET PREP FOR TRICH, YEAST, CLUE     No follow-ups on file.  No future appointments.  Wendi Snipes, FNP

## 2021-12-29 ENCOUNTER — Emergency Department
Admission: EM | Admit: 2021-12-29 | Discharge: 2021-12-29 | Disposition: A | Discharge status: Home / Self Care | Payer: 59 | Attending: Emergency Medicine | Admitting: Emergency Medicine

## 2021-12-29 ENCOUNTER — Encounter: Payer: Self-pay | Admitting: Medical Oncology

## 2021-12-29 DIAGNOSIS — N390 Urinary tract infection, site not specified: Secondary | ICD-10-CM | POA: Insufficient documentation

## 2021-12-29 MED ORDER — CEPHALEXIN 500 MG PO CAPS: 500.0000 mg | ORAL_CAPSULE | Freq: Four times a day (QID) | ORAL | 0 refills | Status: AC

## 2021-12-29 NOTE — ED Triage Notes (Signed)
Pt reports that she has been having UTI sx's for about 2 weeks, Pt reports hx of UTI in past and it feels the same, denies fever.

## 2021-12-29 NOTE — Discharge Instructions (Addendum)
-  Please take all the antibiotics as prescribed. -Return to the emergency department anytime if you begin to experience any new or worsening symptoms.

## 2021-12-29 NOTE — ED Provider Notes (Signed)
[facility] Provider Note    None    (approximate)   History   Chief Complaint Urinary Tract Infection   HPI  Patricia Parrish is a 36 y.o. female, no remarkable medical history, presents emergency department for evaluation of dysuria.  Patient states that she has been having burning sensation, frequency, and foul odor in her urine for the past 2 weeks.  She states that she has had frequent urinary tract infections in the past and this feels like one of them.  Denies hematuria, fever/chills, flank pain, back pain, chest pain, shortness of breath, blood in stool, vaginal discharge/bleeding, or abdominal pain.  History Limitations: No limitations.      Physical Exam  Triage Vital Signs: ED Triage Vitals [12/29/21 1352]  Enc Vitals Group     BP 104/80     Pulse Rate 76     Resp 16     Temp 98.2 F (36.8 C)     Temp Source Oral     SpO2 99 %     Weight 130 lb (59 kg)     Height 5\' 7"  (1.702 m)     Head Circumference      Peak Flow      Pain Score 0     Pain Loc      Pain Edu?      Excl. in [place]?     Most recent vital signs: Vitals:   12/29/21 1352 12/29/21 1614  BP: 104/80 108/76  Pulse: 76 72  Resp: 16 16  Temp: 98.2 F (36.8 C)   SpO2: 99% 99%     Physical Exam Constitutional:      General: She is not in acute distress.    Appearance: Normal appearance. She is not ill-appearing.  Cardiovascular:     Rate and Rhythm: Normal rate and regular rhythm.     Pulses: Normal pulses.     Heart sounds: Normal heart sounds.  Pulmonary:     Effort: Pulmonary effort is normal.     Breath sounds: Normal breath sounds.  Abdominal:     General: Abdomen is flat. There is no distension.     Palpations: Abdomen is soft. There is no mass.     Tenderness: There is no abdominal tenderness. There is no right CVA tenderness or left CVA tenderness.     Hernia: No hernia is present.  Skin:    General: Skin is warm and dry.     Capillary Refill:  Capillary refill takes less than 2 seconds.  Neurological:     Mental Status: She is alert. Mental status is at baseline.      ED Results / Procedures / Treatments  Labs (all labs ordered are listed, but only abnormal results are displayed) Labs Reviewed - No data to display   EKG Not applicable.   RADIOLOGY I personally viewed and evaluated these images as part of my medical decision making, as well as reviewing the written report by the radiologist.  ED Provider Interpretation: Not applicable.  No results found.  PROCEDURES:  Critical Care performed: None.  Procedures    MEDICATIONS ORDERED IN ED: Medications - No data to display   IMPRESSION / MDM / [place] / ED COURSE  I reviewed the triage vital signs and the nursing notes.                              Hollie  Patricia Parrish is a 35 y.o. female, no remarkable medical history, presents emergency department for evaluation of dysuria.  Patient states that she has been having burning sensation, frequency, and foul odor in her urine for the past 2 weeks.  She states that she has had frequent urinary tract infections in the past and this feels like one of them.  Denies hematuria, fever/chills, flank pain, back pain, chest pain, shortness of breath, blood in stool, vaginal discharge/bleeding, or abdominal pain.  Differential diagnosis includes, but is not limited to, cystitis, pyelonephritis, bacterial vaginosis, trichomoniasis, STDs.  Upon entering room, patient appears well.  She is sitting upright comfortably in bed.  NAD.  Physical exam unremarkable.  No CVA tenderness.  No abdominal distention or tenderness.  Patient's history is consistent with a lower urinary tract infection.  Patient states that she is not interested in any testing, including urinalysis, STD testing, or wet prep, as she feels confident that this is her usual urinary tract infection.  Explained to her the risks of foregoing testing, she says  she is willing to incur these risk.  Will go ahead and discharge this patient with a prescription for cephalexin.  Patient was provided with anticipatory guidance, return precautions, and educational material. Encouraged the patient to return to the emergency department at any time if they begin to experience any new or worsening symptoms.       FINAL CLINICAL IMPRESSION(S) / ED DIAGNOSES   Final diagnoses:  Lower urinary tract infectious disease     Rx / DC Orders   ED Discharge Orders          Ordered    cephALEXin (KEFLEX) 500 MG capsule  4 times daily        12/29/21 1603             Note:  This document was prepared using Dragon voice recognition software and may include unintentional dictation errors.   Teodoro Spray, [place] 12/29/21 1625    Duffy Bruce, MD 12/30/21 1942

## 2022-02-02 ENCOUNTER — Other Ambulatory Visit: Payer: Self-pay

## 2022-02-02 ENCOUNTER — Inpatient Hospital Stay (HOSPITAL_COMMUNITY)
Admission: AD | Admit: 2022-02-02 | Discharge: 2022-02-02 | Disposition: A | Discharge status: Home / Self Care | Payer: Medicaid Other | Attending: Obstetrics & Gynecology | Admitting: Obstetrics & Gynecology

## 2022-02-02 ENCOUNTER — Encounter (HOSPITAL_COMMUNITY): Payer: Self-pay | Admitting: Obstetrics & Gynecology

## 2022-02-02 ENCOUNTER — Inpatient Hospital Stay (HOSPITAL_COMMUNITY): Payer: Medicaid Other

## 2022-02-02 DIAGNOSIS — R109 Unspecified abdominal pain: Secondary | ICD-10-CM

## 2022-02-02 DIAGNOSIS — Z3A01 Less than 8 weeks gestation of pregnancy: Secondary | ICD-10-CM

## 2022-02-02 DIAGNOSIS — O26899 Other specified pregnancy related conditions, unspecified trimester: Secondary | ICD-10-CM | POA: Diagnosis not present

## 2022-02-02 LAB — CBC WITH DIFFERENTIAL/PLATELET
Abs Immature Granulocytes: 0.02 10*3/uL (ref 0.00–0.07)
Basophils Absolute: 0 10*3/uL (ref 0.0–0.1)
Basophils Relative: 1 %
Eosinophils Absolute: 0.1 10*3/uL (ref 0.0–0.5)
Eosinophils Relative: 2 %
HCT: 37.1 % (ref 36.0–46.0)
Hemoglobin: 11.5 g/dL — ABNORMAL LOW (ref 12.0–15.0)
Immature Granulocytes: 0 %
Lymphocytes Relative: 21 %
Lymphs Abs: 1.3 10*3/uL (ref 0.7–4.0)
MCH: 26.7 pg (ref 26.0–34.0)
MCHC: 31 g/dL (ref 30.0–36.0)
MCV: 86.3 fL (ref 80.0–100.0)
Monocytes Absolute: 0.6 10*3/uL (ref 0.1–1.0)
Monocytes Relative: 10 %
Neutro Abs: 4.2 10*3/uL (ref 1.7–7.7)
Neutrophils Relative %: 66 %
Platelets: 226 10*3/uL (ref 150–400)
RBC: 4.3 MIL/uL (ref 3.87–5.11)
RDW: 14.9 % (ref 11.5–15.5)
WBC: 6.3 10*3/uL (ref 4.0–10.5)
nRBC: 0 % (ref 0.0–0.2)

## 2022-02-02 LAB — COMPREHENSIVE METABOLIC PANEL
ALT: 14 U/L (ref 0–44)
AST: 11 U/L — ABNORMAL LOW (ref 15–41)
Albumin: 3.9 g/dL (ref 3.5–5.0)
Alkaline Phosphatase: 47 U/L (ref 38–126)
Anion gap: 8 (ref 5–15)
BUN: 11 mg/dL (ref 6–20)
CO2: 24 mmol/L (ref 22–32)
Calcium: 9.1 mg/dL (ref 8.9–10.3)
Chloride: 103 mmol/L (ref 98–111)
Creatinine, Ser: 0.77 mg/dL (ref 0.44–1.00)
GFR, Estimated: >60 mL/min (ref >60)
Glucose, Bld: 93 mg/dL (ref 70–99)
Potassium: 3.7 mmol/L (ref 3.5–5.1)
Sodium: 135 mmol/L (ref 135–145)
Total Bilirubin: 0.8 mg/dL (ref 0.3–1.2)
Total Protein: 7 g/dL (ref 6.5–8.1)

## 2022-02-02 LAB — URINALYSIS, ROUTINE W REFLEX MICROSCOPIC
Bilirubin Urine: NEGATIVE
Glucose, UA: NEGATIVE mg/dL
Hgb urine dipstick: NEGATIVE
Ketones, ur: NEGATIVE mg/dL
Leukocytes,Ua: NEGATIVE
Nitrite: NEGATIVE
Protein, ur: NEGATIVE mg/dL
Specific Gravity, Urine: 1.02 (ref 1.005–1.030)
pH: 6 (ref 5.0–8.0)

## 2022-02-02 LAB — ABO/RH: ABO/RH(D): O POS

## 2022-02-02 LAB — HCG, QUANTITATIVE, PREGNANCY: hCG, Beta Chain, Quant, S: 63092 m[IU]/mL — ABNORMAL HIGH (ref <5)
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
## 2022-02-02 NOTE — Discharge Instructions (Signed)
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
## 2022-03-17 LAB — HM PAP SMEAR

## 2022-09-18 DIAGNOSIS — Z98891 History of uterine scar from previous surgery: Secondary | ICD-10-CM | POA: Insufficient documentation

## 2022-09-25 ENCOUNTER — Encounter (HOSPITAL_COMMUNITY): Payer: Self-pay | Admitting: Emergency Medicine

## 2022-09-25 ENCOUNTER — Other Ambulatory Visit: Payer: Self-pay

## 2022-09-25 ENCOUNTER — Emergency Department (HOSPITAL_COMMUNITY)
Admission: EM | Admit: 2022-09-25 | Discharge: 2022-09-26 | Disposition: A | Discharge status: Home / Self Care | Payer: Medicaid Other | Attending: Emergency Medicine | Admitting: Emergency Medicine

## 2022-09-25 DIAGNOSIS — E876 Hypokalemia: Secondary | ICD-10-CM | POA: Diagnosis not present

## 2022-09-25 DIAGNOSIS — T7840XA Allergy, unspecified, initial encounter: Secondary | ICD-10-CM | POA: Diagnosis present

## 2022-09-25 LAB — CBC WITH DIFFERENTIAL/PLATELET
Abs Immature Granulocytes: 0.02 10*3/uL (ref 0.00–0.07)
Basophils Absolute: 0 10*3/uL (ref 0.0–0.1)
Basophils Relative: 0 %
Eosinophils Absolute: 0.1 10*3/uL (ref 0.0–0.5)
Eosinophils Relative: 1 %
HCT: 39.6 % (ref 36.0–46.0)
Hemoglobin: 12.6 g/dL (ref 12.0–15.0)
Immature Granulocytes: 0 %
Lymphocytes Relative: 23 %
Lymphs Abs: 1.3 10*3/uL (ref 0.7–4.0)
MCH: 28.4 pg (ref 26.0–34.0)
MCHC: 31.8 g/dL (ref 30.0–36.0)
MCV: 89.2 fL (ref 80.0–100.0)
Monocytes Absolute: 0.5 10*3/uL (ref 0.1–1.0)
Monocytes Relative: 9 %
Neutro Abs: 3.6 10*3/uL (ref 1.7–7.7)
Neutrophils Relative %: 67 %
Platelets: 286 10*3/uL (ref 150–400)
RBC: 4.44 MIL/uL (ref 3.87–5.11)
RDW: 13.7 % (ref 11.5–15.5)
WBC: 5.4 10*3/uL (ref 4.0–10.5)
nRBC: 0 % (ref 0.0–0.2)

## 2022-09-25 MED ORDER — METHYLPREDNISOLONE SODIUM SUCC 125 MG IJ SOLR
125.0000 mg | Freq: Once | INTRAMUSCULAR | Status: AC
  Administered 2022-09-25: 125 mg via INTRAVENOUS
  Filled 2022-09-25: qty 2

## 2022-09-25 MED ORDER — FAMOTIDINE IN NACL 20-0.9 MG/50ML-% IV SOLN
20.0000 mg | Freq: Once | INTRAVENOUS | Status: AC
  Administered 2022-09-25: 20 mg via INTRAVENOUS
  Filled 2022-09-25: qty 50

## 2022-09-25 NOTE — ED Provider Triage Note (Signed)
Emergency Medicine Provider Triage Evaluation Note  KATHRIN FOLDEN , a 36 y.o. female  was evaluated in triage.  Pt who is 10 days status post cesarean delivery with bilateral tubal ligation who presents with concern for allergic reaction.  Patient states that she woke up on 10/15, today, with hives and itching on her legs.  Had some itching in her throat intermittently and throughout the day had some progressive swelling of her lower lip.  She denies any hoarseness of voice, sore throat, shortness of breath.  She did take 2 tablets of Benadryl today, 1 approximately 4 hours ago and the other 1 hour ago.  States that rash has resolved but swelling in her lip has not and is continued to increase throughout the evening.  No history of allergic reaction in the past, no new lotions, soaps, or facial products.  States that the only new exposure in her diet was the oxycodone she was prescribed following her cesarean section.  Review of Systems  Positive: As above Negative: Pain, drainage from the cesarean site  Physical Exam  BP (!) 116/93 (BP Location: Right Arm)   Pulse (!) 116   Temp 98.7 F (37.1 C) (Oral)   Resp 16   LMP 12/16/2021   SpO2 99%  Gen:   Awake, no distress   Resp:  Normal effort  MSK:   Moves extremities without difficulty  Other:  Exquisite edema of the lower lip without sublingual or submental tenderness palpation.  Oropharyngeal exam unremarkable with patent upper airway.  No tracheal tenderness to palpation or deviation.  Lungs CTA B.  No evidence of urticaria on total-body skin exam.  Medical Decision Making  Medically screening exam initiated at 10:44 PM.  Appropriate orders placed.  Adianna Darwin Delahunt was informed that the remainder of the evaluation will be completed by another provider, this initial triage assessment does not replace that evaluation, and the importance of remaining in the ED until their evaluation is complete.  Patient with 50 mg of Benadryl in the last 4  hours, will proceed with Pepcid and Solu-Medrol in the ED at this time.  This chart was dictated using voice recognition software, Dragon. Despite the best efforts of this provider to proofread and correct errors, errors may still occur which can change documentation meaning.    Emeline Darling, PA-C 09/25/22 2258

## 2022-09-25 NOTE — ED Triage Notes (Signed)
Pt reports waking up w/ hives on her lower legs and a swollen bottom lip.  Pt is able to speak in complete sentences, normal voice and reports no throat swelling.  She did take 2 benadryl w/in the past four hours.

## 2022-09-26 LAB — COMPREHENSIVE METABOLIC PANEL
ALT: 28 U/L (ref 0–44)
AST: 13 U/L — ABNORMAL LOW (ref 15–41)
Albumin: 3.3 g/dL — ABNORMAL LOW (ref 3.5–5.0)
Alkaline Phosphatase: 100 U/L (ref 38–126)
Anion gap: 12 (ref 5–15)
BUN: 10 mg/dL (ref 6–20)
CO2: 21 mmol/L — ABNORMAL LOW (ref 22–32)
Calcium: 9 mg/dL (ref 8.9–10.3)
Chloride: 105 mmol/L (ref 98–111)
Creatinine, Ser: 0.7 mg/dL (ref 0.44–1.00)
GFR, Estimated: >60 mL/min (ref >60)
Glucose, Bld: 101 mg/dL — ABNORMAL HIGH (ref 70–99)
Potassium: 3.4 mmol/L — ABNORMAL LOW (ref 3.5–5.1)
Sodium: 138 mmol/L (ref 135–145)
Total Bilirubin: 0.6 mg/dL (ref 0.3–1.2)
Total Protein: 7.2 g/dL (ref 6.5–8.1)

## 2022-09-26 MED ORDER — PREDNISONE 10 MG (21) PO TBPK: ORAL_TABLET | Freq: Every day | ORAL | 0 refills | Status: DC

## 2022-09-26 MED ORDER — FAMOTIDINE 20 MG PO TABS: 20.0000 mg | ORAL_TABLET | Freq: Two times a day (BID) | ORAL | 0 refills | Status: DC

## 2022-09-26 MED ORDER — DIPHENHYDRAMINE HCL 50 MG/ML IJ SOLN
25.0000 mg | Freq: Once | INTRAMUSCULAR | Status: AC
  Administered 2022-09-26: 25 mg via INTRAVENOUS
  Filled 2022-09-26: qty 1

## 2022-09-26 MED ORDER — CETIRIZINE HCL 10 MG PO TABS: 10.0000 mg | ORAL_TABLET | Freq: Every day | ORAL | 0 refills | Status: DC

## 2022-09-26 NOTE — ED Notes (Signed)
Swelling continues to go down, however continues to be swollen.

## 2022-09-26 NOTE — ED Provider Notes (Signed)
[facility] EMERGENCY DEPARTMENT Provider Note   CSN: [id number] Arrival date & time: 09/25/22  2232     History  Chief Complaint  Patient presents with   Allergic Reaction    Patricia Parrish is a 36 y.o. female Pt who is 10 days status post cesarean delivery with bilateral tubal ligation who presents with concern for allergic reaction.  Patient states that she woke up on 10/15 with hives and itching on her legs.  Had some itching in her throat intermittently and throughout the day had some progressive swelling of her both her upper and lower lips, but primarily the lower lip.  She denies any hoarseness of voice, sore throat, shortness of breath.  She did take 2 tablets of Benadryl today, 1 approximately 4 hours prior to arrival and the other 1 hour prior to arrival.  States that rash has resolved but swelling in her lip has not and is continued to increase throughout the evening.  No history of allergic reaction in the past, no new lotions, soaps, or facial products.  States that the only new exposure in her diet was the oxycodone she was prescribed following her cesarean section.  No ACE inhibitor usage.  I personally reviewed her medical records.  She does not carry medical diagnoses nor any medications daily.  Patient is G2P2.  HPI     Home Medications Prior to Admission medications   Medication Sig Start Date End Date Taking? Authorizing Provider  cetirizine (ZYRTEC ALLERGY) 10 MG tablet Take 1 tablet (10 mg total) by mouth daily for 10 days. 09/26/22 10/06/22 Yes Gordon Carlson, Lupe Carney R, PA-C  famotidine (PEPCID) 20 MG tablet Take 1 tablet (20 mg total) by mouth 2 (two) times daily for 10 days. 09/26/22 10/06/22 Yes Brookelin Felber, Lupe Carney R, PA-C  predniSONE (STERAPRED UNI-PAK 21 TAB) 10 MG (21) TBPK tablet Take by mouth daily. Take 6 tabs by mouth daily  for 1 days, then 5 tabs for 1 days, then 4 tabs for 1 days, then 3 tabs for 1 days, 2 tabs for 1 days, then 1 tab  by mouth daily for 2 days 09/26/22  Yes Zhaniya Swallows R, PA-C      Allergies    Patient has no known allergies.    Review of Systems   Review of Systems  Constitutional: Negative.   HENT:  Positive for facial swelling.   Respiratory: Negative.    Skin:  Positive for rash.    Physical Exam Updated Vital Signs BP 104/87 (BP Location: Right Arm)   Pulse 86   Temp 98 F (36.7 C) (Oral)   Resp 14   Ht 5\' 7"  (1.702 m)   Wt 57.9 kg   LMP 12/16/2021   SpO2 95%   BMI 19.99 kg/m  Physical Exam Vitals and nursing note reviewed.  Constitutional:      Appearance: She is not ill-appearing or toxic-appearing.  HENT:     Head: Normocephalic and atraumatic.     Nose: Nose normal.     Mouth/Throat:     Mouth: Mucous membranes are moist. Angioedema present.     Tongue: No lesions. Tongue does not deviate from midline.     Pharynx: Oropharynx is clear. Uvula midline. No oropharyngeal exudate or posterior oropharyngeal erythema.     Tonsils: No tonsillar exudate or tonsillar abscesses.   Eyes:     General:        Right eye: No discharge.        Left eye:  No discharge.     Conjunctiva/sclera: Conjunctivae normal.  Neck:     Trachea: Trachea and phonation normal. No tracheal tenderness.  Cardiovascular:     Rate and Rhythm: Normal rate and regular rhythm.     Pulses: Normal pulses.     Heart sounds: Normal heart sounds. No murmur heard. Pulmonary:     Effort: Pulmonary effort is normal. No respiratory distress.     Breath sounds: Normal breath sounds. No wheezing or rales.  Abdominal:     General: Bowel sounds are normal. There is no distension.     Palpations: Abdomen is soft.     Tenderness: There is no abdominal tenderness. There is no guarding or rebound.  Musculoskeletal:        General: No deformity.     Cervical back: Normal range of motion and neck supple. No crepitus.     Right lower leg: No edema.     Left lower leg: No edema.  Lymphadenopathy:     Cervical:  No cervical adenopathy.  Skin:    General: Skin is warm and dry.     Capillary Refill: Capillary refill takes less than 2 seconds.       Neurological:     General: No focal deficit present.     Mental Status: She is alert and oriented to person, place, and time. Mental status is at baseline.  Psychiatric:        Mood and Affect: Mood normal.     ED Results / Procedures / Treatments   Labs (all labs ordered are listed, but only abnormal results are displayed) Labs Reviewed  COMPREHENSIVE METABOLIC PANEL - Abnormal; Notable for the following components:      Result Value   Potassium 3.4 (*)    CO2 21 (*)    Glucose, Bld 101 (*)    Albumin 3.3 (*)    AST 13 (*)    All other components within normal limits  CBC WITH DIFFERENTIAL/PLATELET    EKG None  Radiology No results found.  Procedures .Critical Care  Performed by: Paris Lore, PA-C Authorized by: Paris Lore, PA-C   Critical care provider statement:    Critical care time (minutes):  45   Critical care was time spent personally by me on the following activities:  Development of treatment plan with patient or surrogate, discussions with consultants, evaluation of patient's response to treatment, examination of patient, obtaining history from patient or surrogate, ordering and performing treatments and interventions, ordering and review of laboratory studies, ordering and review of radiographic studies, pulse oximetry and re-evaluation of patient's condition     Medications Ordered in ED Medications  famotidine (PEPCID) IVPB 20 mg premix (0 mg Intravenous Stopped 09/25/22 2335)  methylPREDNISolone sodium succinate (SOLU-MEDROL) 125 mg/2 mL injection 125 mg (125 mg Intravenous Given 09/25/22 2302)  diphenhydrAMINE (BENADRYL) injection 25 mg (25 mg Intravenous Given 09/26/22 0129)    ED Course/ Medical Decision Making/ A&P Clinical Course as of 09/26/22 0251  Sun Sep 25, 2022  2342 Patient  reevaluated, no improvement in lip swelling at this time, no progression of symptoms. Airway remains patent.  [RS]  Mon Sep 26, 2022  0037 Patient reevaluated, improvement in swelling on the left side of the lower lip, no progression of symptoms. Airway remains patent, patient's cardiopulmonary exam remains reassuring. Will order benadryl at this time.  [RS]  0151 Patient reevaluated.  Significant improvement in generalized swelling of the lip, left greater than right.  Airway remains  patent, no sublingual or submental tenderness palpation.  Cardiopulmonary exam is unremarkable.  If patient continues show improvement over the next hour will plan to discharge home. [RS]    Clinical Course User Index [RS] Gianluca Chhim, Eugene Gavia, PA-C                           Medical Decision Making 36 year old female presents with concern for allergic reaction.  Tachycardic and mildly hypertensive on intake and vital signs otherwise normal.  Cardiopulmonary exam is normal, abdominal exam is benign.  Patient with resolving urticaria on bilateral lower extremities.  Oropharyngeal exam as above with exquisite of the lower lip and mild edema of the upper lip.  No sublingual submental tenderness patient to suggest Ludwick's angina.  Amount and/or Complexity of Data Reviewed Labs: ordered.    Details: CBC without leukocytosis or anemia, CMP with mild hypokalemia 3.4, otherwise unremarkable.  Risk Prescription drug management.   Patient administered medications in triage where I initially saw her due to concern for degree of swelling of the lips.  She was observed in triage with this provider for 4 hours after administration of Benadryl, Pepcid, and Solu-Medrol.  And patient did not meet criteria for anaphylaxis, no EpiPen was administered.  Patient's urticaria is completely resolved at time of reevaluation after 4-hour observation.  Though she does still have notable edema of the lower lip is significantly improved  from in her initial presentation and she has not had any progression of her symptoms.  Oropharyngeal exam remains reassuring with patency of the upper airway, no tracheal tenderness or deviation, normal cardiopulmonary exam and vital signs at this time.  Patient endorses improvement in her symptomatology and do feel she is safe to be discharged home at this time.  She was given instructions to discontinue the oxycodone she was previously prescribed as this seems to be the only new medication or exposure she has had in the last 48 hours.  Case was discussed with women and children's pharmacist Herbert Seta given patient is breast-feeding her newborn.  No dosage adjustment required for prednisone taper.  May continue with Benadryl usage for the next 24 hours but then would recommend transitioning to second-generation antihistamine such as cetirizine due to risk of sedation effect in breast-fed newborn on first-generation antihistamine.  We will also discharge with prescription for Pepcid.  Patient remains hemodynamically stable at this time.  No indication for further ED work-up or inpatient management.  Tola and her partner  voiced understanding of her medical evaluation and treatment plan. Each of their questions answered to their expressed satisfaction.  Return precautions were given.  Patient is well-appearing, stable, and was discharged in good condition.  .This chart was dictated using voice recognition software, Dragon. Despite the best efforts of this provider to proofread and correct errors, errors may still occur which can change documentation meaning.   Final Clinical Impression(s) / ED Diagnoses Final diagnoses:  Allergic reaction, initial encounter    Rx / DC Orders ED Discharge Orders          Ordered    cetirizine (ZYRTEC ALLERGY) 10 MG tablet  Daily        09/26/22 0239    famotidine (PEPCID) 20 MG tablet  2 times daily        09/26/22 0239    predniSONE (STERAPRED UNI-PAK 21 TAB)  10 MG (21) TBPK tablet  Daily        09/26/22 0239  Emeline Darling, PA-C 09/26/22 0252    Merrily Pew, MD 09/26/22 [phone]

## 2022-09-26 NOTE — Discharge Instructions (Addendum)
You were seen in the ER today for your right allergic reaction.  You administered medications through your IV with improvement in your symptoms.  You have been prescribed 3 medications to take at home.  You should take the famotidine twice a day for the next 10 days; today you may take the Benadryl you have at home every 6-8 hours.  After today please transition to the cetirizine that was prescribed to you.  Additionally please take the prescribed steroids (prednisone) to complete the entire course. It is safe for you to continue breast-feeding with each of these medications; please do monitor your baby for any excessive sleepiness with the Benadryl.  Follow-up closely with your primary care doctor and return to the ER with any recurrence of the symptoms you came with today or any other new severe symptoms.

## 2023-09-07 ENCOUNTER — Encounter (HOSPITAL_COMMUNITY): Payer: Self-pay

## 2023-09-07 ENCOUNTER — Ambulatory Visit (HOSPITAL_COMMUNITY)
Admission: EM | Admit: 2023-09-07 | Discharge: 2023-09-07 | Disposition: A | Discharge status: Home / Self Care | Payer: 59 | Attending: Nurse Practitioner | Admitting: Nurse Practitioner

## 2023-09-07 DIAGNOSIS — R519 Headache, unspecified: Secondary | ICD-10-CM

## 2023-09-07 DIAGNOSIS — S0993XA Unspecified injury of face, initial encounter: Secondary | ICD-10-CM

## 2023-09-07 MED ORDER — KETOROLAC TROMETHAMINE 30 MG/ML IJ SOLN
30.0000 mg | Freq: Once | INTRAMUSCULAR | Status: AC
  Administered 2023-09-07: 30 mg via INTRAMUSCULAR

## 2023-09-07 MED ORDER — KETOROLAC TROMETHAMINE 30 MG/ML IJ SOLN
INTRAMUSCULAR | Status: AC
  Filled 2023-09-07: qty 1

## 2023-09-07 MED ORDER — IBUPROFEN 800 MG PO TABS: 800.0000 mg | ORAL_TABLET | Freq: Three times a day (TID) | ORAL | 0 refills | Status: DC | PRN

## 2023-09-07 NOTE — Discharge Instructions (Addendum)
As we discussed, the pain in your face is likely muscular.  I do not think anything in your face is fractured and therefore we did not do any x-rays today.  Please start icing your face with a cool compress 15 minutes on, 45 minutes off every hour while awake.  We gave you a shot of Toradol today to help with pain and swelling, do not take any other NSAIDs for the next 24 hours.  You can take Tylenol 500 to 1000 mg every 6 hours for pain and starting tomorrow around this time, you can alternate Tylenol with ibuprofen 800 mg every 8 hours for pain.
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
## 2023-09-07 NOTE — ED Provider Notes (Signed)
[facility]    CSN: [id number] Arrival date & time: 09/07/23  1145      History   Chief Complaint Chief Complaint  Patient presents with   Facial Injury    HPI Patricia Parrish is a 37 y.o. female.   Patient presents today for facial pain after a conveyor belt door hit her in the center of her face at work about 2 hours ago.  She denies loss of consciousness, blurred or double vision, nosebleed, nausea or vomiting, or gait instability since the injury.  Reports she is having significant pain in her face and in her head above the bridge of her nose.  No open wounds.  Has not taken anything for pain so far.    History reviewed. No pertinent past medical history.  There are no problems to display for this patient.   Past Surgical History:  Procedure Laterality Date   CESAREAN SECTION      OB History     Gravida  2   Para      Term      Preterm      AB      Living  1      SAB      IAB      Ectopic      Multiple      Live Births               Home Medications    Prior to Admission medications   Medication Sig Start Date End Date Taking? Authorizing Provider  ibuprofen (ADVIL) 800 MG tablet Take 1 tablet (800 mg total) by mouth every 8 (eight) hours as needed. Take with food to prevent GI upset 09/07/23  Yes Valentino Nose, NP    Family History Family History  Problem Relation Age of Onset   Hypertension Mother    Hypertension Father    Hypertension Maternal Grandmother    Hypertension Maternal Grandfather    Diabetes Paternal Grandmother    Diabetes Paternal Grandfather     Social History Social History   Tobacco Use   Smoking status: Never   Smokeless tobacco: Never  Vaping Use   Vaping status: Never Used  Substance Use Topics   Alcohol use: Yes    Comment: occasional    Drug use: Never     Allergies   Patient has no known allergies.   Review of Systems Review of Systems Per HPI  Physical Exam Triage  Vital Signs ED Triage Vitals  Encounter Vitals Group     BP 09/07/23 1256 128/85     Systolic BP Percentile --      Diastolic BP Percentile --      Pulse Rate 09/07/23 1256 62     Resp 09/07/23 1256 18     Temp 09/07/23 1256 97.7 F (36.5 C)     Temp Source 09/07/23 1256 Oral     SpO2 09/07/23 1256 98 %     Weight --      Height --      Head Circumference --      Peak Flow --      Pain Score 09/07/23 1257 7     Pain Loc --      Pain Education --      Exclude from Growth Chart --    No data found.  Updated Vital Signs BP 128/85 (BP Location: Left Arm)   Pulse 62   Temp 97.7 F (36.5 C) (Oral)  Resp 18   LMP 08/11/2023   SpO2 98%   Breastfeeding No   Visual Acuity Right Eye Distance:   Left Eye Distance:   Bilateral Distance:    Right Eye Near:   Left Eye Near:    Bilateral Near:     Physical Exam Vitals and nursing note reviewed.  Constitutional:      General: She is not in acute distress.    Appearance: Normal appearance. She is not toxic-appearing.  HENT:     Head: Normocephalic and atraumatic. No raccoon eyes, Battle's sign, contusion, right periorbital erythema or left periorbital erythema.     Right Ear: Tympanic membrane, ear canal and external ear normal. No hemotympanum. Tympanic membrane is not perforated or erythematous.     Left Ear: Tympanic membrane, ear canal and external ear normal. No hemotympanum. Tympanic membrane is not perforated or erythematous.     Nose: No nasal deformity, septal deviation, signs of injury, nasal tenderness, mucosal edema, congestion or rhinorrhea.     Right Nostril: No foreign body, epistaxis, septal hematoma or occlusion.     Left Nostril: No foreign body, epistaxis, septal hematoma or occlusion.     Right Turbinates: Not enlarged, swollen or pale.     Left Turbinates: Not enlarged, swollen or pale.      Comments: Patient able to inhale through each nostril when plugging the alternate nostril Eyes:     Extraocular  Movements: Extraocular movements intact.     Right eye: Normal extraocular motion.     Left eye: Normal extraocular motion.     Pupils: Pupils are equal, round, and reactive to light.  Pulmonary:     Effort: Pulmonary effort is normal. No respiratory distress.  Musculoskeletal:     Cervical back: Normal range of motion.  Lymphadenopathy:     Cervical: No cervical adenopathy.  Skin:    General: Skin is warm and dry.     Capillary Refill: Capillary refill takes less than 2 seconds.     Coloration: Skin is not jaundiced or pale.     Findings: No erythema.  Neurological:     Mental Status: She is alert and oriented to person, place, and time.     Cranial Nerves: Cranial nerves 2-12 are intact.     Sensory: Sensation is intact. No sensory deficit.     Motor: Motor function is intact.     Coordination: Coordination is intact. Romberg sign negative. Finger-Nose-Finger Test and Heel to [facility] Test normal. Rapid alternating movements normal.     Gait: Gait is intact. Gait normal.  Psychiatric:        Behavior: Behavior is cooperative.      UC Treatments / Results  Labs (all labs ordered are listed, but only abnormal results are displayed) Labs Reviewed - No data to display  EKG   Radiology No results found.  Procedures Procedures (including critical care time)  Medications Ordered in UC Medications  ketorolac (TORADOL) 30 MG/ML injection 30 mg (30 mg Intramuscular Given 09/07/23 1418)    Initial Impression / Assessment and Plan / UC Course  I have reviewed the triage vital signs and the nursing notes.  Pertinent labs & imaging results that were available during my care of the patient were reviewed by me and considered in my medical decision making (see chart for details).   Patient is well-appearing, normotensive, afebrile, not tachycardic, not tachypneic, oxygenating well on room air.    1. Facial injury, initial encounter 2. Facial pain No red  flags in history or on  exam Low suspicion for nasal fracture; no obvious deformity and patient able to inhale through each nostril Supportive care discussed Pain treated with Toradol 30 mg IM in urgent care today Recommended rest, ice, Tylenol for pain today and start ibuprofen tomorrow Work excuse given Strict ER precautions discussed with patient  The patient was given the opportunity to ask questions.  All questions answered to their satisfaction.  The patient is in agreement to this plan.   Final Clinical Impressions(s) / UC Diagnoses   Final diagnoses:  Facial injury, initial encounter  Facial pain     Discharge Instructions      As we discussed, the pain in your face is likely muscular.  I do not think anything in your face is fractured and therefore we did not do any x-rays today.  Please start icing your face with a cool compress 15 minutes on, 45 minutes off every hour while awake.  We gave you a shot of Toradol today to help with pain and swelling, do not take any other NSAIDs for the next 24 hours.  You can take Tylenol 500 to 1000 mg every 6 hours for pain and starting tomorrow around this time, you can alternate Tylenol with ibuprofen 800 mg every 8 hours for pain.     ED Prescriptions     Medication Sig Dispense Auth. Provider   ibuprofen (ADVIL) 800 MG tablet Take 1 tablet (800 mg total) by mouth every 8 (eight) hours as needed. Take with food to prevent GI upset 21 tablet Valentino Nose, NP      PDMP not reviewed this encounter.   Valentino Nose, NP 09/07/23 1553
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
## 2024-01-11 DIAGNOSIS — Z01419 Encounter for gynecological examination (general) (routine) without abnormal findings: Secondary | ICD-10-CM | POA: Diagnosis not present

## 2024-01-20 ENCOUNTER — Encounter (HOSPITAL_COMMUNITY): Payer: Self-pay

## 2024-01-20 ENCOUNTER — Ambulatory Visit (HOSPITAL_COMMUNITY)
Admission: EM | Admit: 2024-01-20 | Discharge: 2024-01-20 | Disposition: A | Discharge status: Home / Self Care | Payer: 59 | Attending: Emergency Medicine | Admitting: Emergency Medicine

## 2024-01-20 DIAGNOSIS — S61209A Unspecified open wound of unspecified finger without damage to nail, initial encounter: Secondary | ICD-10-CM | POA: Diagnosis not present

## 2024-01-20 MED ORDER — IBUPROFEN 800 MG PO TABS: 800.0000 mg | ORAL_TABLET | Freq: Three times a day (TID) | ORAL | 0 refills | Status: DC

## 2024-01-20 NOTE — ED Provider Notes (Signed)
 [facility]    CSN: [id number] Arrival date & time: 01/20/24  1142      History   Chief Complaint Chief Complaint  Patient presents with   Laceration    HPI Patricia Parrish is a 38 y.o. female.  Laceration occurred about 2 hours ago Washing dishes, glass broke and cut right hand 2nd and 3rd fingers Wrapped with gauze Pain rated 9/10, throbbing No meds taken yet  Reports tetanus up to date 2 years ago  History reviewed. No pertinent past medical history.  There are no active problems to display for this patient.   Past Surgical History:  Procedure Laterality Date   CESAREAN SECTION      OB History     Gravida  2   Para      Term      Preterm      AB      Living  1      SAB      IAB      Ectopic      Multiple      Live Births               Home Medications    Prior to Admission medications   Medication Sig Start Date End Date Taking? Authorizing Provider  ibuprofen  (ADVIL ) 800 MG tablet Take 1 tablet (800 mg total) by mouth 3 (three) times daily. 01/20/24  Yes Dariya Gainer, [place], PA-C    Family History Family History  Problem Relation Age of Onset   Hypertension Mother    Hypertension Father    Hypertension Maternal Grandmother    Hypertension Maternal Grandfather    Diabetes Paternal Grandmother    Diabetes Paternal Grandfather     Social History Social History   Tobacco Use   Smoking status: Never   Smokeless tobacco: Never  Vaping Use   Vaping status: Never Used  Substance Use Topics   Alcohol use: Yes    Comment: occasional    Drug use: Never     Allergies   Patient has no known allergies.   Review of Systems Review of Systems Per HPI  Physical Exam Triage Vital Signs ED Triage Vitals  Encounter Vitals Group     BP 01/20/24 1302 118/84     Systolic BP Percentile --      Diastolic BP Percentile --      Pulse Rate 01/20/24 1302 77     Resp 01/20/24 1302 16     Temp 01/20/24 1302 98.2 F (36.8  C)     Temp Source 01/20/24 1302 Oral     SpO2 01/20/24 1302 98 %     Weight --      Height --      Head Circumference --      Peak Flow --      Pain Score 01/20/24 1301 9     Pain Loc --      Pain Education --      Exclude from Growth Chart --    No data found.  Updated Vital Signs BP 118/84 (BP Location: Left Arm)   Pulse 77   Temp 98.2 F (36.8 C) (Oral)   Resp 16   LMP 01/04/2024   SpO2 98%    Physical Exam Vitals and nursing note reviewed.  Constitutional:      General: She is not in acute distress.    Appearance: Normal appearance.  HENT:     Mouth/Throat:     Pharynx:  Oropharynx is clear.  Cardiovascular:     Rate and Rhythm: Normal rate and regular rhythm.     Heart sounds: Normal heart sounds.  Pulmonary:     Effort: Pulmonary effort is normal.     Breath sounds: Normal breath sounds.  Skin:    Findings: Wound present.     Comments: Right hand dorsal middle finger has skin avulsion over PIP. Lightly bleeding. Distal sensation normal and cap refill < 2 seconds There is one small shallow laceration on dorsal index finger, near DIP. Not bleeding. Well approximated  Neurological:     Mental Status: She is alert and oriented to person, place, and time.      UC Treatments / Results  Labs (all labs ordered are listed, but only abnormal results are displayed) Labs Reviewed - No data to display  EKG  Radiology No results found.  Procedures Procedures   Medications Ordered in UC Medications - No data to display  Initial Impression / Assessment and Plan / UC Course  I have reviewed the triage vital signs and the nursing notes.  Pertinent labs & imaging results that were available during my care of the patient were reviewed by me and considered in my medical decision making (see chart for details).  Skin avulsion and shallow lac not requiring repair.  Cleansed wounds and dressed with abx ointment, non stick dressing. Advised wound care, pain  control, signs of infection to monitor for. Return precautions Patient agrees to plan, no questions  Final Clinical Impressions(s) / UC Diagnoses   Final diagnoses:  Avulsion of skin of finger, initial encounter     Discharge Instructions      Keep dressing on for rest of today Tomorrow morning you can take it off, apply antibiotic ointment, and re-wrap. Repeat tomorrow night as well. You can lightly clean with mild soap and water Keep elevated to avoid swelling Ibuprofen /tuylenol for pain control  Monitor for any signs of infection - increased pain, redness, swelling, or foul drainage. Please return if needed      ED Prescriptions     Medication Sig Dispense Auth. Provider   ibuprofen  (ADVIL ) 800 MG tablet Take 1 tablet (800 mg total) by mouth 3 (three) times daily. 21 tablet Torri Michalski, [place], PA-C      PDMP not reviewed this encounter.   Jeryl Asberry, PA-C 01/20/24 1354

## 2024-01-20 NOTE — Discharge Instructions (Addendum)
 Keep dressing on for rest of today Tomorrow morning you can take it off, apply antibiotic ointment, and re-wrap. Repeat tomorrow night as well. You can lightly clean with mild soap and water Keep elevated to avoid swelling Ibuprofen /tuylenol for pain control  Monitor for any signs of infection - increased pain, redness, swelling, or foul drainage. Please return if needed

## 2024-01-20 NOTE — ED Triage Notes (Signed)
 Patient reports that she was washing dishes and a glass broke cutting her right middle and pointer finger approx 2 hours.  Patient states she received a tetanus when she got hired at her job 2 years ago.

## 2024-03-19 DIAGNOSIS — Z3201 Encounter for pregnancy test, result positive: Secondary | ICD-10-CM | POA: Diagnosis not present

## 2024-03-20 ENCOUNTER — Encounter (HOSPITAL_COMMUNITY)
Admission: AD | Disposition: A | Discharge status: Home / Self Care | Payer: Self-pay | Source: Home / Self Care | Attending: Obstetrics and Gynecology

## 2024-03-20 ENCOUNTER — Other Ambulatory Visit: Payer: Self-pay

## 2024-03-20 ENCOUNTER — Encounter (HOSPITAL_COMMUNITY): Payer: Self-pay | Admitting: Obstetrics and Gynecology

## 2024-03-20 ENCOUNTER — Inpatient Hospital Stay (HOSPITAL_COMMUNITY): Admitting: Registered Nurse

## 2024-03-20 ENCOUNTER — Inpatient Hospital Stay (HOSPITAL_COMMUNITY)

## 2024-03-20 ENCOUNTER — Inpatient Hospital Stay (HOSPITAL_BASED_OUTPATIENT_CLINIC_OR_DEPARTMENT_OTHER): Admitting: Registered Nurse

## 2024-03-20 ENCOUNTER — Ambulatory Visit (HOSPITAL_COMMUNITY)
Admission: AD | Admit: 2024-03-20 | Discharge: 2024-03-21 | Disposition: A | Discharge status: Home / Self Care | Attending: Obstetrics & Gynecology | Admitting: Obstetrics & Gynecology

## 2024-03-20 DIAGNOSIS — O009 Unspecified ectopic pregnancy without intrauterine pregnancy: Secondary | ICD-10-CM | POA: Diagnosis not present

## 2024-03-20 DIAGNOSIS — Z3A10 10 weeks gestation of pregnancy: Secondary | ICD-10-CM | POA: Diagnosis not present

## 2024-03-20 DIAGNOSIS — R9389 Abnormal findings on diagnostic imaging of other specified body structures: Secondary | ICD-10-CM | POA: Diagnosis not present

## 2024-03-20 DIAGNOSIS — R110 Nausea: Secondary | ICD-10-CM | POA: Diagnosis not present

## 2024-03-20 DIAGNOSIS — O00102 Left tubal pregnancy without intrauterine pregnancy: Principal | ICD-10-CM | POA: Insufficient documentation

## 2024-03-20 DIAGNOSIS — R1084 Generalized abdominal pain: Secondary | ICD-10-CM | POA: Diagnosis not present

## 2024-03-20 DIAGNOSIS — R188 Other ascites: Secondary | ICD-10-CM | POA: Diagnosis not present

## 2024-03-20 DIAGNOSIS — R109 Unspecified abdominal pain: Secondary | ICD-10-CM | POA: Diagnosis not present

## 2024-03-20 DIAGNOSIS — O26891 Other specified pregnancy related conditions, first trimester: Secondary | ICD-10-CM | POA: Diagnosis not present

## 2024-03-20 DIAGNOSIS — R10811 Right upper quadrant abdominal tenderness: Secondary | ICD-10-CM | POA: Diagnosis not present

## 2024-03-20 DIAGNOSIS — Z3A00 Weeks of gestation of pregnancy not specified: Secondary | ICD-10-CM

## 2024-03-20 DIAGNOSIS — K661 Hemoperitoneum: Secondary | ICD-10-CM | POA: Diagnosis present

## 2024-03-20 DIAGNOSIS — D62 Acute posthemorrhagic anemia: Secondary | ICD-10-CM | POA: Diagnosis present

## 2024-03-20 DIAGNOSIS — R1111 Vomiting without nausea: Secondary | ICD-10-CM | POA: Diagnosis not present

## 2024-03-20 DIAGNOSIS — Z302 Encounter for sterilization: Secondary | ICD-10-CM | POA: Diagnosis not present

## 2024-03-20 DIAGNOSIS — R1011 Right upper quadrant pain: Secondary | ICD-10-CM | POA: Diagnosis not present

## 2024-03-20 DIAGNOSIS — N848 Polyp of other parts of female genital tract: Secondary | ICD-10-CM | POA: Diagnosis not present

## 2024-03-20 HISTORY — DX: Other specified health status: Z78.9

## 2024-03-20 HISTORY — PX: LAPAROSCOPIC BILATERAL SALPINGECTOMY: SHX5889

## 2024-03-20 HISTORY — PX: DIAGNOSTIC LAPAROSCOPY WITH REMOVAL OF ECTOPIC PREGNANCY: SHX6449

## 2024-03-20 HISTORY — PX: LAPAROSCOPY: SHX197

## 2024-03-20 LAB — CBC
HCT: 32 % — ABNORMAL LOW (ref 36.0–46.0)
HCT: 21.3 % — ABNORMAL LOW (ref 36.0–46.0)
Hemoglobin: 9.8 g/dL — ABNORMAL LOW (ref 12.0–15.0)
Hemoglobin: 6.8 g/dL — CL (ref 12.0–15.0)
MCH: 27.4 pg (ref 26.0–34.0)
MCH: 28.2 pg (ref 26.0–34.0)
MCHC: 30.6 g/dL (ref 30.0–36.0)
MCHC: 31.9 g/dL (ref 30.0–36.0)
MCV: 89.4 fL (ref 80.0–100.0)
MCV: 88.4 fL (ref 80.0–100.0)
Platelets: 261 10*3/uL (ref 150–400)
Platelets: 162 10*3/uL (ref 150–400)
RBC: 3.58 MIL/uL — ABNORMAL LOW (ref 3.87–5.11)
RBC: 2.41 MIL/uL — ABNORMAL LOW (ref 3.87–5.11)
RDW: 14.9 % (ref 11.5–15.5)
RDW: 15.1 % (ref 11.5–15.5)
WBC: 11.2 10*3/uL — ABNORMAL HIGH (ref 4.0–10.5)
WBC: 10 10*3/uL (ref 4.0–10.5)
nRBC: 0 % (ref 0.0–0.2)
nRBC: 0 % (ref 0.0–0.2)

## 2024-03-20 LAB — URINALYSIS, ROUTINE W REFLEX MICROSCOPIC
Bilirubin Urine: NEGATIVE
Glucose, UA: NEGATIVE mg/dL
Hgb urine dipstick: NEGATIVE
Ketones, ur: 20 mg/dL — AB
Leukocytes,Ua: NEGATIVE
Nitrite: NEGATIVE
Protein, ur: 30 mg/dL — AB
Specific Gravity, Urine: 1.028 (ref 1.005–1.030)
pH: 5 (ref 5.0–8.0)

## 2024-03-20 LAB — COMPREHENSIVE METABOLIC PANEL WITH GFR
ALT: 14 U/L (ref 0–44)
AST: 17 U/L (ref 15–41)
Albumin: 3.9 g/dL (ref 3.5–5.0)
Alkaline Phosphatase: 37 U/L — ABNORMAL LOW (ref 38–126)
Anion gap: 14 (ref 5–15)
BUN: 12 mg/dL (ref 6–20)
CO2: 19 mmol/L — ABNORMAL LOW (ref 22–32)
Calcium: 8.5 mg/dL — ABNORMAL LOW (ref 8.9–10.3)
Chloride: 103 mmol/L (ref 98–111)
Creatinine, Ser: 0.88 mg/dL (ref 0.44–1.00)
GFR, Estimated: >60 mL/min (ref >60)
Glucose, Bld: 180 mg/dL — ABNORMAL HIGH (ref 70–99)
Potassium: 3.5 mmol/L (ref 3.5–5.1)
Sodium: 136 mmol/L (ref 135–145)
Total Bilirubin: 1.6 mg/dL — ABNORMAL HIGH (ref 0.0–1.2)
Total Protein: 7.1 g/dL (ref 6.5–8.1)

## 2024-03-20 LAB — WET PREP, GENITAL
Clue Cells Wet Prep HPF POC: NONE SEEN
Sperm: NONE SEEN
Trich, Wet Prep: NONE SEEN
WBC, Wet Prep HPF POC: <10 (ref <10)
Yeast Wet Prep HPF POC: NONE SEEN

## 2024-03-20 LAB — PREPARE RBC (CROSSMATCH)

## 2024-03-20 LAB — HCG, QUANTITATIVE, PREGNANCY: hCG, Beta Chain, Quant, S: 6806 m[IU]/mL — ABNORMAL HIGH (ref <5)

## 2024-03-20 SURGERY — LAPAROSCOPY, DIAGNOSTIC
Anesthesia: General | Site: Abdomen

## 2024-03-20 MED ORDER — MIDAZOLAM HCL 2 MG/2ML IJ SOLN
INTRAMUSCULAR | Status: DC | PRN
  Administered 2024-03-20: 2 mg via INTRAVENOUS

## 2024-03-20 MED ORDER — ONDANSETRON HCL 4 MG PO TABS: 4.0000 mg | ORAL_TABLET | Freq: Four times a day (QID) | ORAL | Status: DC | PRN

## 2024-03-20 MED ORDER — LACTATED RINGERS IV SOLN: INTRAVENOUS | Status: DC

## 2024-03-20 MED ORDER — SENNA 8.6 MG PO TABS
1.0000 | ORAL_TABLET | Freq: Two times a day (BID) | ORAL | Status: DC
  Administered 2024-03-20 – 2024-03-21 (×2): 8.6 mg via ORAL
  Filled 2024-03-20 (×2): qty 1

## 2024-03-20 MED ORDER — SUCCINYLCHOLINE CHLORIDE 200 MG/10ML IV SOSY
PREFILLED_SYRINGE | INTRAVENOUS | Status: DC | PRN
  Administered 2024-03-20: 100 mg via INTRAVENOUS

## 2024-03-20 MED ORDER — FENTANYL CITRATE (PF) 100 MCG/2ML IJ SOLN: 25.0000 ug | INTRAMUSCULAR | Status: DC | PRN

## 2024-03-20 MED ORDER — MIDAZOLAM HCL 2 MG/2ML IJ SOLN
INTRAMUSCULAR | Status: AC
  Filled 2024-03-20: qty 2

## 2024-03-20 MED ORDER — PROPOFOL 10 MG/ML IV BOLUS
INTRAVENOUS | Status: AC
Start: 2024-03-20 — End: ?
  Filled 2024-03-20: qty 20

## 2024-03-20 MED ORDER — SIMETHICONE 80 MG PO CHEW: 80.0000 mg | CHEWABLE_TABLET | Freq: Four times a day (QID) | ORAL | Status: DC | PRN

## 2024-03-20 MED ORDER — HYDROMORPHONE HCL 1 MG/ML IJ SOLN
1.0000 mg | Freq: Once | INTRAMUSCULAR | Status: AC
  Administered 2024-03-20: 1 mg via INTRAVENOUS
  Filled 2024-03-20: qty 1

## 2024-03-20 MED ORDER — DEXAMETHASONE SODIUM PHOSPHATE 10 MG/ML IJ SOLN
INTRAMUSCULAR | Status: DC | PRN
  Administered 2024-03-20: 10 mg via INTRAVENOUS

## 2024-03-20 MED ORDER — ALBUMIN HUMAN 5 % IV SOLN: INTRAVENOUS | Status: DC | PRN

## 2024-03-20 MED ORDER — HYDROMORPHONE HCL 1 MG/ML IJ SOLN: 0.2000 mg | INTRAMUSCULAR | Status: DC | PRN

## 2024-03-20 MED ORDER — 0.9 % SODIUM CHLORIDE (POUR BTL) OPTIME
TOPICAL | Status: DC | PRN
  Administered 2024-03-20: 1000 mL

## 2024-03-20 MED ORDER — SUGAMMADEX SODIUM 200 MG/2ML IV SOLN
INTRAVENOUS | Status: DC | PRN
  Administered 2024-03-20: 200 mg via INTRAVENOUS

## 2024-03-20 MED ORDER — FENTANYL CITRATE (PF) 250 MCG/5ML IJ SOLN
INTRAMUSCULAR | Status: AC
Start: 2024-03-20 — End: ?
  Filled 2024-03-20: qty 5

## 2024-03-20 MED ORDER — TRANEXAMIC ACID-NACL 1000-0.7 MG/100ML-% IV SOLN
INTRAVENOUS | Status: AC
  Filled 2024-03-20: qty 100

## 2024-03-20 MED ORDER — LACTATED RINGERS IV SOLN: INTRAVENOUS | Status: DC | PRN

## 2024-03-20 MED ORDER — ROCURONIUM BROMIDE 10 MG/ML (PF) SYRINGE
PREFILLED_SYRINGE | INTRAVENOUS | Status: DC | PRN
  Administered 2024-03-20: 40 mg via INTRAVENOUS
  Administered 2024-03-20: 10 mg via INTRAVENOUS

## 2024-03-20 MED ORDER — SODIUM CHLORIDE 0.9 % IR SOLN
Status: DC | PRN
  Administered 2024-03-20: 3000 mL

## 2024-03-20 MED ORDER — ONDANSETRON HCL 4 MG/2ML IJ SOLN: 4.0000 mg | Freq: Four times a day (QID) | INTRAMUSCULAR | Status: DC | PRN

## 2024-03-20 MED ORDER — FENTANYL CITRATE (PF) 250 MCG/5ML IJ SOLN
INTRAMUSCULAR | Status: DC | PRN
  Administered 2024-03-20: 50 ug via INTRAVENOUS
  Administered 2024-03-20: 100 ug via INTRAVENOUS
  Administered 2024-03-20: 50 ug via INTRAVENOUS

## 2024-03-20 MED ORDER — MENTHOL 3 MG MT LOZG
1.0000 | LOZENGE | OROMUCOSAL | Status: DC | PRN
  Administered 2024-03-20: 3 mg via ORAL
  Filled 2024-03-20: qty 9

## 2024-03-20 MED ORDER — LIDOCAINE 2% (20 MG/ML) 5 ML SYRINGE
INTRAMUSCULAR | Status: DC | PRN
  Administered 2024-03-20: 60 mg via INTRAVENOUS

## 2024-03-20 MED ORDER — OXYCODONE HCL 5 MG PO TABS
5.0000 mg | ORAL_TABLET | ORAL | Status: DC | PRN
  Administered 2024-03-20: 5 mg via ORAL
  Filled 2024-03-20: qty 1

## 2024-03-20 MED ORDER — FUROSEMIDE 10 MG/ML IJ SOLN
20.0000 mg | Freq: Once | INTRAMUSCULAR | Status: AC
  Administered 2024-03-20: 20 mg via INTRAVENOUS
  Filled 2024-03-20: qty 2

## 2024-03-20 MED ORDER — LACTATED RINGERS IV BOLUS
1000.0000 mL | Freq: Once | INTRAVENOUS | Status: AC
  Administered 2024-03-20: 1000 mL via INTRAVENOUS

## 2024-03-20 MED ORDER — DIPHENHYDRAMINE HCL 25 MG PO CAPS
25.0000 mg | ORAL_CAPSULE | Freq: Once | ORAL | Status: AC
  Administered 2024-03-20: 25 mg via ORAL
  Filled 2024-03-20: qty 1

## 2024-03-20 MED ORDER — OXYCODONE HCL 5 MG PO TABS: 5.0000 mg | ORAL_TABLET | Freq: Once | ORAL | Status: DC | PRN

## 2024-03-20 MED ORDER — GABAPENTIN 100 MG PO CAPS
200.0000 mg | ORAL_CAPSULE | Freq: Two times a day (BID) | ORAL | Status: DC
  Administered 2024-03-20 – 2024-03-21 (×2): 200 mg via ORAL
  Filled 2024-03-20 (×2): qty 2

## 2024-03-20 MED ORDER — HEMOSTATIC AGENTS (NO CHARGE) OPTIME
TOPICAL | Status: DC | PRN
Start: 2024-03-20 — End: 2024-03-20
  Administered 2024-03-20: 1

## 2024-03-20 MED ORDER — ACETAMINOPHEN 325 MG PO TABS: 650.0000 mg | ORAL_TABLET | Freq: Once | ORAL | Status: DC

## 2024-03-20 MED ORDER — POLYETHYLENE GLYCOL 3350 17 G PO PACK: 17.0000 g | PACK | Freq: Every day | ORAL | Status: DC | PRN

## 2024-03-20 MED ORDER — ALUM & MAG HYDROXIDE-SIMETH 200-200-20 MG/5ML PO SUSP: 30.0000 mL | ORAL | Status: DC | PRN

## 2024-03-20 MED ORDER — PROPOFOL 10 MG/ML IV BOLUS
INTRAVENOUS | Status: DC | PRN
  Administered 2024-03-20: 100 mg via INTRAVENOUS

## 2024-03-20 MED ORDER — ACETAMINOPHEN 500 MG PO TABS
1000.0000 mg | ORAL_TABLET | Freq: Four times a day (QID) | ORAL | Status: DC
  Administered 2024-03-20 – 2024-03-21 (×3): 1000 mg via ORAL
  Filled 2024-03-20 (×3): qty 2

## 2024-03-20 MED ORDER — IBUPROFEN 600 MG PO TABS
600.0000 mg | ORAL_TABLET | Freq: Four times a day (QID) | ORAL | Status: DC
  Filled 2024-03-20: qty 1

## 2024-03-20 MED ORDER — FENTANYL CITRATE (PF) 250 MCG/5ML IJ SOLN
INTRAMUSCULAR | Status: AC
  Filled 2024-03-20: qty 5

## 2024-03-20 MED ORDER — SODIUM CHLORIDE 0.9% IV SOLUTION: Freq: Once | INTRAVENOUS | Status: AC

## 2024-03-20 MED ORDER — KETOROLAC TROMETHAMINE 30 MG/ML IJ SOLN
30.0000 mg | Freq: Four times a day (QID) | INTRAMUSCULAR | Status: DC
  Administered 2024-03-20 – 2024-03-21 (×3): 30 mg via INTRAVENOUS
  Filled 2024-03-20 (×3): qty 1

## 2024-03-20 MED ORDER — PHENYLEPHRINE HCL-NACL 20-0.9 MG/250ML-% IV SOLN
INTRAVENOUS | Status: DC | PRN
  Administered 2024-03-20: 20 ug/min via INTRAVENOUS

## 2024-03-20 MED ORDER — OXYCODONE HCL 5 MG/5ML PO SOLN: 5.0000 mg | Freq: Once | ORAL | Status: DC | PRN

## 2024-03-20 MED ORDER — ONDANSETRON HCL 4 MG/2ML IJ SOLN
4.0000 mg | Freq: Once | INTRAMUSCULAR | Status: AC
  Administered 2024-03-20: 4 mg via INTRAVENOUS
  Filled 2024-03-20: qty 2

## 2024-03-20 MED ORDER — ONDANSETRON HCL 4 MG/2ML IJ SOLN
4.0000 mg | Freq: Four times a day (QID) | INTRAMUSCULAR | Status: DC | PRN
  Administered 2024-03-20: 4 mg via INTRAVENOUS
  Filled 2024-03-20: qty 2

## 2024-03-20 MED ORDER — ONDANSETRON HCL 4 MG/2ML IJ SOLN
INTRAMUSCULAR | Status: DC | PRN
  Administered 2024-03-20: 4 mg via INTRAVENOUS

## 2024-03-20 MED ORDER — SODIUM CHLORIDE 0.9 % IV SOLN: INTRAVENOUS | Status: DC | PRN

## 2024-03-20 SURGICAL SUPPLY — 32 items
APPLICATOR ARISTA FLEXITIP XL (MISCELLANEOUS) IMPLANT
APPLICATOR COTTON TIP 6 STRL (MISCELLANEOUS) IMPLANT
APPLICATOR COTTON TIP 6IN STRL (MISCELLANEOUS) ×2 IMPLANT
DERMABOND ADVANCED .7 DNX12 (GAUZE/BANDAGES/DRESSINGS) ×3 IMPLANT
DURAPREP 26ML APPLICATOR (WOUND CARE) ×3 IMPLANT
ELECT REM PT RETURN 9FT ADLT (ELECTROSURGICAL) ×2 IMPLANT
ELECTRODE REM PT RTRN 9FT ADLT (ELECTROSURGICAL) ×3 IMPLANT
GLOVE BIO SURGEON STRL SZ7 (GLOVE) ×3 IMPLANT
GLOVE BIOGEL PI IND STRL 7.0 (GLOVE) ×6 IMPLANT
GOWN STRL REUS W/ TWL LRG LVL3 (GOWN DISPOSABLE) ×3 IMPLANT
GOWN STRL REUS W/ TWL XL LVL3 (GOWN DISPOSABLE) ×3 IMPLANT
HEMOSTAT ARISTA ABSORB 3G PWDR (HEMOSTASIS) IMPLANT
IRRIG SUCT STRYKERFLOW 2 WTIP (MISCELLANEOUS) ×2 IMPLANT
IRRIGATION SUCT STRKRFLW 2 WTP (MISCELLANEOUS) IMPLANT
KIT TURNOVER KIT B (KITS) ×3 IMPLANT
LIGASURE LAP MARYLAND 5MM 37CM (ELECTROSURGICAL) IMPLANT
PACK LAPAROSCOPY BASIN (CUSTOM PROCEDURE TRAY) ×3 IMPLANT
SEALER TISSUE G2 CVD JAW 35 (ENDOMECHANICALS) IMPLANT
SET TUBE SMOKE EVAC HIGH FLOW (TUBING) ×3 IMPLANT
SLEEVE ADV FIXATION 5X100MM (TROCAR) IMPLANT
SLEEVE Z-THREAD 5X100MM (TROCAR) ×3 IMPLANT
SUT VICRYL 0 UR6 27IN ABS (SUTURE) IMPLANT
SUT VICRYL 4-0 PS2 18IN ABS (SUTURE) ×3 IMPLANT
SYS BAG RETRIEVAL 10MM (BASKET) IMPLANT
SYSTEM BAG RETRIEVAL 10MM (BASKET) IMPLANT
TOWEL GREEN STERILE FF (TOWEL DISPOSABLE) ×6 IMPLANT
TRAY FOLEY W/BAG SLVR 14FR (SET/KITS/TRAYS/PACK) ×3 IMPLANT
TROCAR 11X100 Z THREAD (TROCAR) IMPLANT
TROCAR ADV FIXATION 11X100MM (TROCAR) IMPLANT
TROCAR ADV FIXATION 5X100MM (TROCAR) IMPLANT
TROCAR Z-THREAD OPTICAL 5X100M (TROCAR) ×3 IMPLANT
WARMER LAPAROSCOPE (MISCELLANEOUS) ×3 IMPLANT

## 2024-03-20 NOTE — Progress Notes (Signed)
 Patient hemoglobin dropped from 9.8 to 6.8. Transfusion recommended, she accepted. 2 PRBCs ordered, will follow up post transfusion hemoglobin tomorrow monring. Continue routine postoperative care.    Jaynie Collins, MD, FACOG Obstetrician & Gynecologist, [facility] for [employer], [facility]

## 2024-03-20 NOTE — H&P (Signed)
   PRE OPERATIVE HISTORY AND PHYSICAL  Subjective:  Patricia Parrish is a 38 y.o. G3P0 at [redacted]w[redacted]d by LMP 01/05/22 with suspected ruptured ectopic pregnancy  Presented via EMS. Reports pain x 1 week that acutely worsened today. +nausea, dizziness. Has not eaten since last night. Has hx bilateral tubal ligation via modified P[place]oy. Had positive UPT yesterday.   In [facility]U, pt has persistent abdominal pain despite received dilaudid 1mg  x1 at 1227. BP 90s/60s, pt denies ever being told she has low BP. Non tachycardic. On exam, she has diffuse tenderness with rebound tenderness & guarding. Hgb 9.8, hCG in process but can see positive result in Care everywhere from yesterday. Images reviewed with ultrasonographer at bedside. No IUP. Fluid present in CDS, paracolic gutters, and around the spleen.   PMH: denies PSH: CS x2, BTL Meds: denies All: NKDA OB: CS x2 Soc: no tobacco use  Objective:   Vitals:   03/20/24 1235 03/20/24 1240 03/20/24 1245 03/20/24 1320  BP:    (!) 97/54  Pulse:    74  Resp:    (!) 22  Temp:      TempSrc:      SpO2: 99% 99% 100%   Height:        General:  Oriented and cooperative. Clearly in distress with eye closed.   Skin: Skin is warm and dry. No rash noted.   Cardiovascular: Normal heart rate noted  Respiratory: Normal respiratory effort, no problems with respiration noted  Abdomen: Soft, non distended. Diffusely tender with rebound and guarding  Pelvic: Deferred   Assessment and Plan:  Patricia Parrish is a 38 y.o. with suspected ruptured ectopic pregnancy with plan for diagnostic laparoscopy and bilateral salpingectomy.   Counseled on suspicion for ruptured ectopic with peritoneum and recommended surgical management. She is s/p BTL via P[place]oy in 2023 and she confirms that this was not a planned/desired pregnancy. She would like bilateral salpingectomy to reduce risk of recurrent ectopic pregnancy or pregnancy in the future.   - Diagnosis: ruptured ectopic  pregnancy - Planned surgery: diagnostic laparoscopy, bilateral salpingectomy  - Risks of surgery include but are not limited to: bleeding, infection, injury to surrounding organs/tissues (i.e. bowel/bladder/ureters), need for additional procedures, wound complications, hospital re-admission, and conversion to open surgery, VTE - We discussed postop restrictions, precautions and expectations - Case added on emergently. To OR when ready.  - Plan for extended recovery given emergent nature of this care  Lennart Pall, MD

## 2024-03-20 NOTE — Progress Notes (Signed)
 Came to check on patient, she is doing well after her first unit of PRBC, second unit about to be transfused.  No concerns, vitals stable. Will follow up post transfusion hemoglobin in the morning. Continue routine care.  Jaynie Collins, MD, FACOG Obstetrician & Gynecologist, [facility] for [employer], [facility]

## 2024-03-20 NOTE — Transfer of Care (Signed)
 Immediate Anesthesia Transfer of Care Note  Patient: Patricia Parrish  Procedure(s) Performed: DIAGNOSTIC LAPAROSCOPY (Bilateral: Abdomen)  Patient Location: PACU  Anesthesia Type:General  Level of Consciousness: sedated  Airway & Oxygen Therapy: Patient Spontanous Breathing and Patient connected to face mask oxygen  Post-op Assessment: Report given to RN and Post -op Vital signs reviewed and stable  Post vital signs: Reviewed and stable  Last Vitals:  Vitals Value Taken Time  BP 117/75 03/20/24 1619  Temp    Pulse 101   Resp 23 03/20/24 1621  SpO2 100   Vitals shown include unfiled device data.  Last Pain:  Vitals:   03/20/24 1400  TempSrc:   PainSc: 10-Worst pain ever         Complications: No notable events documented.

## 2024-03-20 NOTE — MAU Provider Note (Signed)
 Chief Complaint:  Abdominal Pain   HPI   None     Patricia Parrish is a 38 y.o. G3P0 at Unknown who presents to maternity admissions reporting abdominal pain. The pain is located in the LLQ. Onset 1 week ago but intensity increased overnight and is now 10/10 and radiates to the umbilical area. She endorses chills, dizziness, nausea, and 3 episodes of emesis the past 24 hours. She is not able to keep fluids down, her urine today is dark and cloudy. Denies vaginal bleeding or discharge. She has not taken any nausea or pain medications at home. Aggravating factors include movement, pain worsened while going over bumps in the ambulance.  Alleviating factors include none. She does not have pruritus, does not have rash. She is s/p tubal ligation 09/16/2022. She had a positive pregnancy test at her OBGYN yesterday, no confirmed IUP. Denies previous pain like this.  Pregnancy Course: Receives care at [facility]. Prenatal records not available yet, positive pregnancy test there was yesterday.  Past Medical History:  Diagnosis Date   Medical history non-contributory    OB History  Gravida Para Term Preterm AB Living  3     1  SAB IAB Ectopic Multiple Live Births          # Outcome Date GA Lbr Len/2nd Weight Sex Type Anes PTL Lv  3 Current           2 Gravida           1 [place]            Past Surgical History:  Procedure Laterality Date   CESAREAN SECTION     CESAREAN SECTION WITH BILATERAL TUBAL LIGATION     Family History  Problem Relation Age of Onset   Hypertension Mother    Hypertension Father    Hypertension Maternal Grandmother    Hypertension Maternal Grandfather    Diabetes Paternal Grandmother    Diabetes Paternal Grandfather    Social History   Tobacco Use   Smoking status: Never   Smokeless tobacco: Never  Vaping Use   Vaping status: Never Used  Substance Use Topics   Alcohol use: Not Currently    Comment: occasional    Drug use: Never   No Known  Allergies Medications Prior to Admission  Medication Sig Dispense Refill Last Dose/Taking   ibuprofen (ADVIL) 800 MG tablet Take 1 tablet (800 mg total) by mouth 3 (three) times daily. 21 tablet 0     I have reviewed patient's Past Medical Hx, Surgical Hx, Family Hx, Social Hx, medications and allergies.   ROS  Pertinent items noted in HPI and remainder of comprehensive ROS otherwise negative.   PHYSICAL EXAM  Patient Vitals for the past 24 hrs:  BP Temp Temp src Pulse Resp SpO2 Height  03/20/24 1320 (!) 97/54 -- -- 74 (!) 22 -- --  03/20/24 1245 -- -- -- -- -- 100 % --  03/20/24 1240 -- -- -- -- -- 99 % --  03/20/24 1235 -- -- -- -- -- 99 % --  03/20/24 1230 -- -- -- -- -- 100 % --  03/20/24 1225 -- -- -- -- -- 100 % --  03/20/24 1220 -- -- -- -- -- 100 % --  03/20/24 1215 -- -- -- -- -- 100 % --  03/20/24 1210 -- -- -- -- -- 100 % --  03/20/24 1205 -- -- -- -- -- 100 % --  03/20/24 1200 -- -- -- -- -- 100 % --  03/20/24 1155 -- -- -- -- -- 100 % --  03/20/24 1150 -- -- -- -- -- 100 % --  03/20/24 1145 -- -- -- -- -- 99 % --  03/20/24 1140 -- -- -- -- -- 98 % --  03/20/24 1136 90/63 -- -- 79 -- -- --  03/20/24 1135 -- -- -- -- -- 98 % --  03/20/24 1130 -- -- -- -- -- 98 % --  03/20/24 1126 (!) 91/54 97.6 F (36.4 C) Oral 76 16 100 % --  03/20/24 1120 -- -- -- -- -- -- 5\' 7"  (1.702 m)    Constitutional: Well-developed, well-nourished female. Patient laying uncomfortably in hospital bed, visibly trying not to move from current LLD position.  Cardiovascular: normal rate & rhythm, warm and well-perfused Respiratory: normal effort, no problems with respiration noted. CTA bilaterally. GI: normoactive bowel sounds. RUQ tender to light palpation, LLQ tender to light palpation, RLQ tender to light palpation. Rebound tenderness. Positive peritoneal signs. MSK: Extremities nontender, no edema, normal ROM Skin: warm and dry. Acyanotic, no jaundice or pallor. Neurologic: Alert and  oriented x 4. No abnormal coordination. Psychiatric: Normal mood. Speech not slurred, not rapid/pressured. Patient is cooperative.    Labs: Results for orders placed or performed during the hospital encounter of 03/20/24 (from the past 24 hours)  Urinalysis, Routine w reflex microscopic -Urine, Clean Catch     Status: Abnormal   Collection Time: 03/20/24 11:36 AM  Result Value Ref Range   Color, Urine YELLOW YELLOW   APPearance HAZY (A) CLEAR   Specific Gravity, Urine 1.028 1.005 - 1.030   pH 5.0 5.0 - 8.0   Glucose, UA NEGATIVE NEGATIVE mg/dL   Hgb urine dipstick NEGATIVE NEGATIVE   Bilirubin Urine NEGATIVE NEGATIVE   Ketones, ur 20 (A) NEGATIVE mg/dL   Protein, ur 30 (A) NEGATIVE mg/dL   Nitrite NEGATIVE NEGATIVE   Leukocytes,Ua NEGATIVE NEGATIVE   RBC / HPF 0-5 0 - 5 RBC/hpf   WBC, UA 6-10 0 - 5 WBC/hpf   Bacteria, UA MANY (A) NONE SEEN   Squamous Epithelial / HPF 0-5 0 - 5 /HPF   Mucus PRESENT   Comprehensive metabolic panel     Status: Abnormal   Collection Time: 03/20/24 12:33 PM  Result Value Ref Range   Sodium 136 135 - 145 mmol/L   Potassium 3.5 3.5 - 5.1 mmol/L   Chloride 103 98 - 111 mmol/L   CO2 19 (L) 22 - 32 mmol/L   Glucose, Bld 180 (H) 70 - 99 mg/dL   BUN 12 6 - 20 mg/dL   Creatinine, Ser 5.62 0.44 - 1.00 mg/dL   Calcium 8.5 (L) 8.9 - 10.3 mg/dL   Total Protein 7.1 6.5 - 8.1 g/dL   Albumin 3.9 3.5 - 5.0 g/dL   AST 17 15 - 41 U/L   ALT 14 0 - 44 U/L   Alkaline Phosphatase 37 (L) 38 - 126 U/L   Total Bilirubin 1.6 (H) 0.0 - 1.2 mg/dL   GFR, Estimated >13 >08 mL/min   Anion gap 14 5 - 15  CBC     Status: Abnormal   Collection Time: 03/20/24 12:33 PM  Result Value Ref Range   WBC 11.2 (H) 4.0 - 10.5 K/uL   RBC 3.58 (L) 3.87 - 5.11 MIL/uL   Hemoglobin 9.8 (L) 12.0 - 15.0 g/dL   HCT 65.7 (L) 84.6 - 96.2 %   MCV 89.4 80.0 - 100.0 fL   MCH 27.4 26.0 - 34.0  pg   MCHC 30.6 30.0 - 36.0 g/dL   RDW 08.6 57.8 - 46.9 %   Platelets 261 150 - 400 K/uL   nRBC 0.0  0.0 - 0.2 %  Wet prep, genital     Status: None   Collection Time: 03/20/24 12:35 PM  Result Value Ref Range   Yeast Wet Prep HPF POC NONE SEEN NONE SEEN   Trich, Wet Prep NONE SEEN NONE SEEN   Clue Cells Wet Prep HPF POC NONE SEEN NONE SEEN   WBC, Wet Prep HPF POC <10 <10   Sperm NONE SEEN     Imaging:  No results found.  MDM & MAU COURSE  MDM: High  MAU Course: -Patient afebrile with normal pulse but is hypotensive. -Peru workup. -IV fluids and Dilaudid for pain management. Zofran for nausea. -Still hypotensive even after 1 L fluids, no improvement in BP. Hemoglobin 9.8 with diffuse blood in abdominal cavity on transabdominal US. -Consulted Dr. Berton Lan, present during transvaginal US to confirm there is no IUP. Recommends immediate surgery for suspected fallopian tube rupture from ectopic pregnancy.    Differential diagnosis considered for lower abdominal pain includes but is not limited to: round ligament pain, ectopic pregnancy, torsion, UTI, pyelonephritis, PID, cervicitis, appendicitis, diverticulitis, constipation, nephrolithiasis  Orders Placed This Encounter  Procedures   Wet prep, genital   US OB LESS THAN 14 WEEKS WITH OB TRANSVAGINAL   US Abdomen Complete   Comprehensive metabolic panel   Urinalysis, Routine w reflex microscopic -Urine, Clean Catch   CBC   hCG, quantitative, pregnancy   Type and screen [facility]   ABO/Rh   Insert peripheral IV   Meds ordered this encounter  Medications   lactated ringers bolus 1,000 mL   HYDROmorphone (DILAUDID) injection 1 mg   ondansetron (ZOFRAN) injection 4 mg   lactated ringers bolus 1,000 mL    ASSESSMENT   1. Ruptured ectopic pregnancy   2. [redacted] weeks gestation of pregnancy     PLAN  Admitted for emergency laparotomy per Dr. Berton Lan. She discussed risks and benefits with patient, patient agreeable to surgery. Consent was signed.    Melida Quitter, PA

## 2024-03-20 NOTE — Op Note (Signed)
 Maddix S Vezina PROCEDURE DATE: 03/20/2024  PREOPERATIVE DIAGNOSIS: Ruptured ectopic pregnancy, unwanted fertility POSTOPERATIVE DIAGNOSIS: Ruptured left fallopian tube ectopic pregnancy, unwanted fertility PROCEDURE: Laparoscopic bilateral salpingectomy and removal of ectopic pregnancy SURGEON:  Dr. Harvie Bridge ANESTHESIOLOGY TEAM: Anesthesiologist: Achille Rich, MD CRNA: Loleta Rose, CRNA  INDICATIONS: 38 y.o. G3P0 at [redacted]w[redacted]d here with the preoperative diagnoses as listed above.  Please refer to preoperative notes for more details. Patient was counseled regarding need for laparoscopic salpingectomy. Risks of surgery including bleeding which may require transfusion or reoperation, infection, injury to bowel or other surrounding organs, need for additional procedures including laparotomy and other postoperative/anesthesia complications were explained to patient.  Written informed consent was obtained.  FINDINGS: 1.1L of hemoperitoneum.  Knuckle of fallopian tube of left side containing ectopic gestation near the cornua. Filmy omental adhesions to anterior abdominal wall. Filmy adhesions between bladder and uterus, other wise normal uterus. Disrupted right fallopian tube c/w postsurgical changes. Small simple cyst on left ovary. Normal right ovary.   ANESTHESIA: General INTRAVENOUS FLUIDS: 2500 ml crystalloid, albumin ESTIMATED BLOOD LOSS: 10 ml URINE OUTPUT: 100 ml SPECIMENS: Left fallopian tube containing ectopic gestation. Right fallopian tube. COMPLICATIONS: None immediate  PROCEDURE IN DETAIL:  The patient was taken to the operating room where general anesthesia was administered and was found to be adequate.  She was placed in the dorsal lithotomy position, and was prepped and draped in a sterile manner.  A Foley catheter was inserted into her bladder and attached to constant drainage and a uterine manipulator was then advanced into the uterus.    She was prepped and draped in  the usual sterile fashion in the dorsal lithotomy position. In Hasson fashion, a 1cm infraumbilical incision was made and fascia was identified with blunt dissection. The fascia was elevated and sharply divided. The peritoneum was sharply divided. The Hasson trocar was placed.   Below the point of entry and the pelvis was inspected and there was no evidence of injury. Hemoperitoneum noted in the pelvis. She was placed in steep Trendelenburg.  The scalpel was then used to make two 5mm incisions, one suprapubic and one in the LLQ.  Two 5mm ports were inserted under direct visualization. Filmy omental adhesions to the anterior abdominal wall were taken down with the Ligasure to allow for placement of the suprapubic port. Bowel visualized well away from the omentum and site of transection.   The Nezhat suction irrigator was then used to suction the hemoperitoneum.  Attention was then turned to the left fallopian tube which was grasped and ligated from the underlying mesosalpinx and uterine attachment using the Ligasure. The specimen was removed from the abdomen through the 10mm port. Good hemostasis was noted. Attention was turned to the remaining right fallopian tube. The tube was elevated well away from the pelvic sidewall & underlying structures. The mesosalpinx was ligated and transected along the length of the tube and the specimen was removed. Attention was turned back to evacuation of hemoperitoneum. Once satisfactorily evacuated the surgical sites were irrigated and inspected. Along the left tube, there was some separation of the broad ligament because of the expected post-BTL changes in that left tube. There also appeared to be some mild disruption of the left cornua. This was elevated away from underlying structures and re-sealed for hemostasis. Reinspection of pelvis confirmed excellent hemostasis. The 5mm ports were removed under direct visualization. The abdomen was desufflated, and all instruments were  removed.    The umbilical fascia was closed with  interrupted 0-vicryl. The incisions were then closed in a subcuticular fashion with 4-0 vicryl and dermabond was placed. She was taken to recovery in stable condition.  Rh status: Rh+  Given the amount of blood loss, patient will be observed overnight in the hospital.  Will discharge in the morning if she remains stable.  Will recheck hemoglobin immediately post op and ascertain need for possible transfusion.  Harvie Bridge, MD Obstetrician & Gynecologist, [facility] for [employer], [facility]

## 2024-03-20 NOTE — Anesthesia Procedure Notes (Signed)
 Procedure Name: Intubation Date/Time: 03/20/2024 2:30 PM  Performed by: Loleta Keyry Iracheta, CRNAPre-anesthesia Checklist: Patient identified, Patient being monitored, Timeout performed, Emergency Drugs available and Suction available Patient Re-evaluated:Patient Re-evaluated prior to induction Oxygen Delivery Method: Circle system utilized Preoxygenation: Pre-oxygenation with 100% oxygen Induction Type: IV induction Ventilation: Mask ventilation without difficulty Laryngoscope Size: Mac and 4 Grade View: Grade I Tube type: Oral Tube size: 7.0 mm Number of attempts: 1 Airway Equipment and Method: Stylet Placement Confirmation: ETT inserted through vocal cords under direct vision, positive ETCO2 and breath sounds checked- equal and bilateral Secured at: 22 cm Tube secured with: Tape Dental Injury: Teeth and Oropharynx as per pre-operative assessment

## 2024-03-20 NOTE — Anesthesia Preprocedure Evaluation (Signed)
 Anesthesia Evaluation  Patient identified by MRN, date of birth, ID band Patient awake    Reviewed: Allergy & Precautions, H&P , NPO status , Patient's Chart, lab work & pertinent test resultsPreop documentation limited or incomplete due to emergent nature of procedure.  Airway Mallampati: II   Neck ROM: full    Dental   Pulmonary neg pulmonary ROS   breath sounds clear to auscultation       Cardiovascular negative cardio ROS  Rhythm:regular Rate:Normal     Neuro/Psych    GI/Hepatic   Endo/Other    Renal/GU      Musculoskeletal   Abdominal   Peds  Hematology Hemoglobin 9.8   Anesthesia Other Findings   Reproductive/Obstetrics Ruptured ectopic pregnancy                             Anesthesia Physical Anesthesia Plan  ASA: 1 and emergent  Anesthesia Plan: General   Post-op Pain Management:    Induction: Intravenous  PONV Risk Score and Plan: 3 and Ondansetron, Dexamethasone, Midazolam and Treatment may vary due to age or medical condition  Airway Management Planned: Oral ETT  Additional Equipment:   Intra-op Plan:   Post-operative Plan: Extubation in OR  Informed Consent: I have reviewed the patients History and Physical, chart, labs and discussed the procedure including the risks, benefits and alternatives for the proposed anesthesia with the patient or authorized representative who has indicated his/her understanding and acceptance.     Dental advisory given  Plan Discussed with: CRNA, Anesthesiologist and Surgeon  Anesthesia Plan Comments:        Anesthesia Quick Evaluation

## 2024-03-20 NOTE — MAU Note (Signed)
..  Patricia Parrish is a 38 y.o. at [redacted]w[redacted]d here in MAU reporting: left lower abdominal pain that has been ongoing for a week but increased in intensity over night. She rates the pain as a 10/10. The pain radiates towards the middle of her stomach.   Pt denies vaginal bleeding.   Pt reports dizziness, nausea, and vomitting x3 in the last 24 hours. Not able to keep liquids down. Urine sample dark amber and cloudy.   Has not taken any medications for nausea or pain.   Pt has a hx of cesarean section x2.   Vitals:   03/20/24 1126  BP: (!) 91/54  Pulse: 76  Resp: 16  Temp: 97.6 F (36.4 C)  SpO2: 100%     FHT:n/a Lab orders placed from triage:  UA

## 2024-03-21 ENCOUNTER — Encounter: Payer: Self-pay | Admitting: Obstetrics & Gynecology

## 2024-03-21 ENCOUNTER — Encounter (HOSPITAL_COMMUNITY): Payer: Self-pay | Admitting: Obstetrics and Gynecology

## 2024-03-21 LAB — GC/CHLAMYDIA PROBE AMP (~~LOC~~) NOT AT ARMC
Chlamydia: NEGATIVE
Comment: NEGATIVE
Comment: NORMAL
Neisseria Gonorrhea: NEGATIVE

## 2024-03-21 LAB — CBC
HCT: 25 % — ABNORMAL LOW (ref 36.0–46.0)
Hemoglobin: 8.3 g/dL — ABNORMAL LOW (ref 12.0–15.0)
MCH: 28.6 pg (ref 26.0–34.0)
MCHC: 33.2 g/dL (ref 30.0–36.0)
MCV: 86.2 fL (ref 80.0–100.0)
Platelets: 152 10*3/uL (ref 150–400)
RBC: 2.9 MIL/uL — ABNORMAL LOW (ref 3.87–5.11)
RDW: 15.4 % (ref 11.5–15.5)
WBC: 10.9 10*3/uL — ABNORMAL HIGH (ref 4.0–10.5)
nRBC: 0 % (ref 0.0–0.2)

## 2024-03-21 MED ORDER — IBUPROFEN 800 MG PO TABS: 800.0000 mg | ORAL_TABLET | Freq: Three times a day (TID) | ORAL | 3 refills | Status: AC | PRN

## 2024-03-21 MED ORDER — OXYCODONE HCL 5 MG PO TABS: 5.0000 mg | ORAL_TABLET | ORAL | 0 refills | Status: DC | PRN

## 2024-03-21 MED ORDER — STOOL SOFTENER/LAXATIVE 50-8.6 MG PO TABS: 1.0000 | ORAL_TABLET | Freq: Two times a day (BID) | ORAL | 1 refills | Status: DC | PRN

## 2024-03-21 MED ORDER — FERROUS SULFATE 324 (65 FE) MG PO TBEC: 1.0000 | DELAYED_RELEASE_TABLET | ORAL | 3 refills | Status: DC

## 2024-03-21 MED ORDER — ACETAMINOPHEN 500 MG PO TABS: 1000.0000 mg | ORAL_TABLET | Freq: Four times a day (QID) | ORAL | Status: AC | PRN

## 2024-03-21 MED ORDER — GABAPENTIN 100 MG PO CAPS: 200.0000 mg | ORAL_CAPSULE | Freq: Two times a day (BID) | ORAL | 2 refills | Status: DC

## 2024-03-21 MED ORDER — ONDANSETRON 4 MG PO TBDP: 4.0000 mg | ORAL_TABLET | Freq: Four times a day (QID) | ORAL | 0 refills | Status: DC | PRN

## 2024-03-21 NOTE — Discharge Instructions (Signed)

## 2024-03-21 NOTE — Anesthesia Postprocedure Evaluation (Signed)
 Anesthesia Post Note  Patient: Patricia Parrish  Procedure(s) Performed: DIAGNOSTIC LAPAROSCOPY (Bilateral: Abdomen) SALPINGECTOMY, BILATERAL, LAPAROSCOPIC (Bilateral: Abdomen) LAPAROSCOPIC REMOVAL OF ECTOPIC PREGNANCY (Abdomen)     Patient location during evaluation: PACU Anesthesia Type: General Level of consciousness: awake and alert Pain management: pain level controlled Vital Signs Assessment: post-procedure vital signs reviewed and stable Respiratory status: spontaneous breathing, nonlabored ventilation, respiratory function stable and patient connected to nasal cannula oxygen Cardiovascular status: blood pressure returned to baseline and stable Postop Assessment: no apparent nausea or vomiting Anesthetic complications: no   No notable events documented.  Last Vitals:  Vitals:   03/21/24 0735 03/21/24 0737  BP: (!) 98/56   Pulse: 83   Resp: 17   Temp: 37.2 C   SpO2: 100% 100%    Last Pain:  Vitals:   03/21/24 0900  TempSrc:   PainSc: 0-No pain                 [name] S

## 2024-03-21 NOTE — Discharge Summary (Addendum)
 Gynecology Physician Postoperative Discharge Summary  Patient ID: Patricia Parrish MRN: [id number] DOB/AGE: 08/19/1986 37 y.o.  Admit Date: 03/20/2024 Discharge Date: 03/21/2024  Preoperative Diagnoses:  Principal Problem:   Ruptured left tubal ectopic pregnancy causing hemoperitoneum Active Problems:   Acute blood loss anemia  Procedures: Procedure(s) (LRB): DIAGNOSTIC LAPAROSCOPY (Bilateral) SALPINGECTOMY, BILATERAL, LAPAROSCOPIC (Bilateral) LAPAROSCOPIC REMOVAL OF ECTOPIC PREGNANCY (N/A)  Hospital Course:  Patricia Parrish is a 38 y.o. G3P0 admitted for the aforementioned diagnosis requiring surgery.  She underwent the procedures as mentioned above, her operation was complicated by acute blood loss anemia due to hemoperitoneum needing transfusion of two pRBCs. For further details about surgery, please refer to the operative report. Patient had an otherwise uncomplicated postoperative course. By time of discharge on POD#1, her pain was controlled on oral pain medications; she was ambulating, voiding without difficulty, tolerating regular diet and passing flatus. Her post transfusion hemoglobin was 9.9, she had no symptoms of anemia, but was discharged on oral iron therapy. She was deemed stable for discharge to home.   Significant Labs:    Latest Ref Rng & Units 03/21/2024    6:01 AM 03/20/2024    5:59 PM 03/20/2024   12:33 PM  CBC  WBC 4.0 - 10.5 K/uL 10.9  10.0  11.2   Hemoglobin 12.0 - 15.0 g/dL 8.3  6.8  9.8   Hematocrit 36.0 - 46.0 % 25.0  21.3  32.0   Platelets 150 - 400 K/uL 152  162  261     Discharge Exam: Blood pressure 104/78, pulse 88, temperature 99.1 F (37.3 C), temperature source Oral, resp. rate 16, height 5\' 7"  (1.702 m), last menstrual period 01/04/2024, SpO2 100%. General appearance: alert and no distress  Resp: clear to auscultation bilaterally  Cardio: regular rate and rhythm  GI: soft, non-tender; bowel sounds normal; no masses, no organomegaly.  Incision:  C/D/I, no erythema, no drainage noted, Dermabond in place Pelvic: deferred Extremities: extremities normal, atraumatic, no cyanosis or edema and Homans sign is negative, no sign of DVT  Discharged Condition: Stable  Disposition: Discharge disposition: 01-Home or Self Care        Allergies as of 03/21/2024   No Known Allergies      Medication List     TAKE these medications    acetaminophen 500 MG tablet Commonly known as: TYLENOL Take 2 tablets (1,000 mg total) by mouth every 6 (six) hours as needed for mild pain (pain score 1-3) or headache.   ferrous sulfate 324 (65 Fe) MG Tbec Take 1 tablet (325 mg total) by mouth every other day.   gabapentin 100 MG capsule Commonly known as: NEURONTIN Take 2 capsules (200 mg total) by mouth 2 (two) times daily.   ibuprofen 800 MG tablet Commonly known as: ADVIL Take 1 tablet (800 mg total) by mouth 3 (three) times daily with meals as needed for mild pain (pain score 1-3), moderate pain (pain score 4-6) or cramping. What changed:  when to take this reasons to take this   ondansetron 4 MG disintegrating tablet Commonly known as: ZOFRAN-ODT Take 1 tablet (4 mg total) by mouth every 6 (six) hours as needed for nausea.   oxyCODONE 5 MG immediate release tablet Commonly known as: Oxy IR/ROXICODONE Take 1 tablet (5 mg total) by mouth every 4 (four) hours as needed for severe pain (pain score 7-10) or breakthrough pain.   Stool Softener/Laxative 50-8.6 MG tablet Generic drug: senna-docusate Take 1 tablet by mouth 2 (two) times daily as  needed for mild constipation.       No future appointments.  Follow-up Information     [facility] for [facility] Follow up in 2 week(s).   Specialty: Obstetrics and Gynecology Why: You will be contacted with appointment date and time for postoperative appointment that will be in 2-3 weeks Contact information: [address] [place]  [place] [address] [phone]                Total discharge time: 20 minutes   Signed:  Jaynie Collins, MD, FACOG Attending Obstetrician & Gynecologist Faculty Practice, [facility]

## 2024-03-22 LAB — SURGICAL PATHOLOGY

## 2024-03-24 LAB — TYPE AND SCREEN
ABO/RH(D): O POS
Antibody Screen: NEGATIVE
Unit division: 0
Unit division: 0
Unit division: 0
Unit division: 0

## 2024-03-24 LAB — BPAM RBC
Blood Product Expiration Date: 202504122359
Blood Product Expiration Date: 202504302359
Blood Product Expiration Date: 202504302359
Blood Product Expiration Date: 202505072359
ISSUE DATE / TIME: 202504092015
ISSUE DATE / TIME: 202504092329
Unit Type and Rh: 5100
Unit Type and Rh: 5100
Unit Type and Rh: 5100
Unit Type and Rh: 5100

## 2024-03-26 ENCOUNTER — Telehealth: Payer: Self-pay | Admitting: *Deleted

## 2024-03-26 NOTE — Telephone Encounter (Signed)
 RTC. Pt concerned for passing blood clots when going to the bathroom. Recent surgery for ruptured ectopic pregnancy on 03/20/24. No VB immediately following surgery. Advised pt VB/clots likely represent the start of a menstrual cycle since the pregnancy is resolved and hormone levels back to pre pregnant level. Advised pt to seek care for heavy VB/ filling 1 pad or more per hour or passing very large clots. Pt verbalized understanding.

## 2024-04-11 ENCOUNTER — Encounter: Payer: Self-pay | Admitting: Obstetrics and Gynecology

## 2024-04-11 ENCOUNTER — Ambulatory Visit: Admitting: Obstetrics and Gynecology

## 2024-04-11 VITALS — BP 105/72 | HR 76 | Ht 67.0 in | Wt 132.8 lb

## 2024-04-11 DIAGNOSIS — Z30017 Encounter for initial prescription of implantable subdermal contraceptive: Secondary | ICD-10-CM

## 2024-04-11 DIAGNOSIS — Z09 Encounter for follow-up examination after completed treatment for conditions other than malignant neoplasm: Secondary | ICD-10-CM

## 2024-04-11 DIAGNOSIS — N946 Dysmenorrhea, unspecified: Secondary | ICD-10-CM

## 2024-04-11 MED ORDER — ETONOGESTREL 68 MG ~~LOC~~ IMPL
68.0000 mg | DRUG_IMPLANT | Freq: Once | SUBCUTANEOUS | Status: AC
  Administered 2024-04-11: 68 mg via SUBCUTANEOUS

## 2024-04-11 NOTE — Patient Instructions (Addendum)
 Please keep the tan wrap on for 24 hours to prevent bruising. The Dresser bandages can come off in a week if they don't fall off on their own.   Please take a pregnancy test in 2 weeks and make sure that it is negative.   Talk to your primary care doctor about the pain in your right arm. If they think it could be related to your breast, let me know and we can get a mammogram/ultrasound.

## 2024-04-11 NOTE — Progress Notes (Signed)
 Pt presents for f/u. Pt denies pain or bleeding. Pt interested in Nexplanon . No unprotected sex since procedure

## 2024-04-11 NOTE — Progress Notes (Signed)
   POST OPERATIVE GYNECOLOGY VISIT  Subjective:  Patricia Parrish is a 38 y.o. G3P0 3 weeks s/p dx lsc & bilateral salpingectomy for ruptured tubal ectopic pregnancy presenting for post op appointment  Path 03/20/24 - left fallopian tube with intratubal chorionic villi & decidua; normal right fallopian tube Intra op images available for review  Today patient reports she is doing well. Not longer needing pain medications. Ambulating, tolerating regular diet, and no issues with bowel/bladder function.   Requests Nexplanon  placement as second form of contraception given that she became pregnant after a tubal ligation and then had a life-threatening complication.  She also has painful periods.  Objective:   Vitals:   04/11/24 1018  BP: 105/72  Pulse: 76  Weight: 132 lb 12.8 oz (60.2 kg)  Height: 5\' 7"  (1.702 m)    General:  Alert, oriented and cooperative. Patient is in no acute distress.  Skin: Skin is warm and dry. No rash noted.   Cardiovascular: Normal heart rate noted  Respiratory: Normal respiratory effort, no problems with respiration noted  Abdomen: Soft, non-tender, non-distended.  Incisions well-healed   Assessment and Plan:  Patricia Parrish is a 38 y.o. 3 weeks s/p dx lsc & bilateral salpingectomy for ruptured tubal ectopic pregnancy   No lifting more than 10-15lbs or soaking incisions for 1 additional week Discussed avoidance of sun exposure May return to work Nexplanon  placed for additional contraception and to help with dysmenorrhea.  See separate procedure note Pap up-to-date  Return if symptoms worsen or fail to improve.  Izell Marsh, MD

## 2024-04-12 NOTE — Progress Notes (Signed)
   GYNECOLOGY OFFICE PROCEDURE NOTE  Patricia Parrish is a 38 y.o. G3P0 here for Nexplanon  insertion.  Last pap smear was on 11/2021 and was normal.   Nexplanon  Insertion Procedure Patient identified, informed consent performed, consent signed.   Patient does understand that irregular bleeding is a very common side effect of this medication. She was advised to have backup contraception for one week after placement. Pregnancy test not indicated, but discussed that she can take a home pregnancy test in 2 weeks for reassurance. Appropriate time out taken.    Patient's left arm was prepped and draped in the usual sterile fashion. The insertion site was marked according to manufacturer instructions.  Patient was prepped with Betadine and then injected with 3 ml of 1% lidocaine .  Nexplanon  removed from packaging,  Device confirmed in needle, then inserted full length of needle and withdrawn per handbook instructions. Nexplanon  was able to palpated in the patient's arm; patient palpated the insert herself. There was minimal blood loss.  Patient insertion site covered with guaze and a pressure bandage to reduce any bruising.    The patient tolerated the procedure well and was given post procedure instructions.   Loralyn Rochester, MD Obstetrician & Gynecologist, [facility] for [employer], [facility]

## 2024-06-06 ENCOUNTER — Ambulatory Visit: Admitting: Obstetrics and Gynecology

## 2024-06-06 VITALS — BP 125/87 | HR 80 | Ht 67.0 in | Wt 133.0 lb

## 2024-06-06 DIAGNOSIS — L7632 Postprocedural hematoma of skin and subcutaneous tissue following other procedure: Secondary | ICD-10-CM

## 2024-06-06 DIAGNOSIS — N941 Unspecified dyspareunia: Secondary | ICD-10-CM | POA: Diagnosis not present

## 2024-06-06 DIAGNOSIS — R300 Dysuria: Secondary | ICD-10-CM | POA: Diagnosis not present

## 2024-06-06 DIAGNOSIS — K5900 Constipation, unspecified: Secondary | ICD-10-CM | POA: Diagnosis not present

## 2024-06-06 DIAGNOSIS — T148XXA Other injury of unspecified body region, initial encounter: Secondary | ICD-10-CM

## 2024-06-06 LAB — POCT URINALYSIS DIPSTICK
Bilirubin, UA: NEGATIVE
Glucose, UA: NEGATIVE
Ketones, UA: NEGATIVE
Leukocytes, UA: NEGATIVE
Nitrite, UA: NEGATIVE
Protein, UA: POSITIVE — AB
Spec Grav, UA: 1.01 (ref 1.010–1.025)
Urobilinogen, UA: 0.2 U/dL
pH, UA: 7 (ref 5.0–8.0)

## 2024-06-06 NOTE — Patient Instructions (Addendum)
 Nexplanon  bruising: Warm wash cloth over the bruise 20 minutes 1-2 times a day for 2 weeks & try taking ibuprofen  600mg  every 6 hours as needed or every 6 hours scheduled for 2-3 days  Pelvic floor stretches: https://www.esht.nhs.uk/wp-content/uploads/2022/07/1003.pdf  Bladder foods: http://lawson-lopez.info/  Helpful things for constipation: - Kiwi fruit with skin on (only 1 per day!) - Increase fiber in diet and drink lots of water - Over the counter laxatives - Miralax  or senna daily. Senna can cause more cramping, so Miralax  might be most helpful. Drink with lots of water or you can get dehydrated

## 2024-06-06 NOTE — Progress Notes (Signed)
   RETURN GYNECOLOGY VISIT  Subjective:  Patricia Parrish is a 38 y.o. G3P0 presenting for nexplanon  pain and bladder pain  Nexplanon  placed by myself 5/1. She developed a bruise that has not gone away and is still painful/tender. Pain was worse last week but got a little better. Otherwise wants to keep nexplanon  in.  She is also s/p dx lsc and lsc BS after ruptured ectopic pregnancy on 4/9. Since her surgery, she notes bladder pain at the end of urination. No frequency/urgency. She also has dyspareunia with significant LLQ pain worse since surgery but had been present prior to surgery. Reports infrequent, hard bowel movements and straining to have BM.   Objective:   Vitals:   06/06/24 1612  BP: 125/87  Pulse: 80  Weight: 133 lb (60.3 kg)  Height: 5' 7 (1.702 m)   General:  Alert, oriented and cooperative. Patient is in no acute distress.  Skin: Skin is warm and dry. No rash noted. Small ~1x1cm tender hyperpigmented area at the proximal end of her nexplanon . Nexplanon  slightly shallow there but within acceptable depth for placement.   Cardiovascular: Normal heart rate noted  Respiratory: Normal respiratory effort, no problems with respiration noted   Assessment and Plan:  Patricia Parrish is a 38 y.o. with the following:  1. Hematoma (Primary) Suspect small hematoma at proximal end of nexplanon , otherwise well healed Offered removal but she would like to keep it in place Will trial warm compresses and NSAIDs to see if pain improves.   2. Dyspareunia in female 3. Constipation, unspecified constipation type 4. Dysuria Suspect pelvic floor pain/dysfunction Urine dip with blood & protein, negative LE/nitrite. Sent for culture but low concern for UTI Discussed pelvic floor stretches, bladder diet, and management of constipation Agreeable to PFPT  - Ambulatory referral to Physical Therapy  Opts to follow up prn  Total encounter time: 22 minutes  Kieth JAYSON Carolin, MD

## 2024-06-06 NOTE — Progress Notes (Signed)
 Pt is in office for Nexplanon  check - pt states bruising at site. Pt has had pain since insertion. Pt has not tried any at home relief.

## 2024-06-08 DIAGNOSIS — H5213 Myopia, bilateral: Secondary | ICD-10-CM | POA: Diagnosis not present

## 2024-06-08 LAB — URINE CULTURE

## 2024-06-10 ENCOUNTER — Ambulatory Visit: Payer: Self-pay | Admitting: Obstetrics and Gynecology

## 2024-08-07 ENCOUNTER — Other Ambulatory Visit: Payer: Self-pay

## 2024-08-07 ENCOUNTER — Ambulatory Visit: Attending: Obstetrics and Gynecology

## 2024-08-07 DIAGNOSIS — R279 Unspecified lack of coordination: Secondary | ICD-10-CM | POA: Insufficient documentation

## 2024-08-07 DIAGNOSIS — N941 Unspecified dyspareunia: Secondary | ICD-10-CM | POA: Diagnosis not present

## 2024-08-07 DIAGNOSIS — K5900 Constipation, unspecified: Secondary | ICD-10-CM | POA: Insufficient documentation

## 2024-08-07 DIAGNOSIS — R1032 Left lower quadrant pain: Secondary | ICD-10-CM | POA: Insufficient documentation

## 2024-08-07 DIAGNOSIS — M62838 Other muscle spasm: Secondary | ICD-10-CM | POA: Diagnosis not present

## 2024-08-07 DIAGNOSIS — R102 Pelvic and perineal pain: Secondary | ICD-10-CM | POA: Diagnosis not present

## 2024-08-07 DIAGNOSIS — M6281 Muscle weakness (generalized): Secondary | ICD-10-CM | POA: Insufficient documentation

## 2024-08-07 DIAGNOSIS — R300 Dysuria: Secondary | ICD-10-CM | POA: Diagnosis not present

## 2024-08-07 NOTE — Therapy (Signed)
 OUTPATIENT PHYSICAL THERAPY FEMALE PELVIC EVALUATION   Patient Name: Patricia Parrish MRN: [id number] DOB:1986-02-03, 38 y.o., female Today's Date: 08/07/2024  END OF SESSION:  PT End of Session - 08/07/24 0845     Visit Number 1    Date for PT Re-Evaluation 01/22/25    Authorization Type [employer] Employee    PT Start Time 0845    PT Stop Time 0925    PT Time Calculation (min) 40 min    Activity Tolerance Patient tolerated treatment well    Behavior During Therapy [facility] for tasks assessed/performed          Past Medical History:  Diagnosis Date   Medical history non-contributory    Past Surgical History:  Procedure Laterality Date   CESAREAN SECTION     CESAREAN SECTION WITH BILATERAL TUBAL LIGATION     DIAGNOSTIC LAPAROSCOPY WITH REMOVAL OF ECTOPIC PREGNANCY N/A 03/20/2024   Procedure: LAPAROSCOPIC REMOVAL OF ECTOPIC PREGNANCY;  Surgeon: Erik Kieth BROCKS, MD;  Location: [facility] OR;  Service: Gynecology;  Laterality: N/A;   LAPAROSCOPIC BILATERAL SALPINGECTOMY Bilateral 03/20/2024   Procedure: SALPINGECTOMY, BILATERAL, LAPAROSCOPIC;  Surgeon: Erik Kieth BROCKS, MD;  Location: [facility] OR;  Service: Gynecology;  Laterality: Bilateral;   LAPAROSCOPY Bilateral 03/20/2024   Procedure: DIAGNOSTIC LAPAROSCOPY;  Surgeon: Erik Kieth BROCKS, MD;  Location: [facility] OR;  Service: Gynecology;  Laterality: Bilateral;   Patient Active Problem List   Diagnosis Date Noted   Acute blood loss anemia 03/20/2024    PCP: NA  REFERRING PROVIDER: Erik Kieth BROCKS, MD   REFERRING DIAG: N94.10 (ICD-10-CM) - Dyspareunia in female K54.00 (ICD-10-CM) - Constipation, unspecified constipation type R30.0 (ICD-10-CM) - Dysuria  THERAPY DIAG:  Abdominal wall pain in left lower quadrant  Pelvic pain  Other muscle spasm  Muscle weakness (generalized)  Unspecified lack of coordination  Rationale for Evaluation and Treatment: Rehabilitation  ONSET DATE: 03/2024  SUBJECTIVE:                                                                                                                                                                                            SUBJECTIVE STATEMENT: Pt states that she has been having persistent Lt lower quadrant pain since bil salpingectomy after ectopic pregnancy in April 2025.    PAIN:  Are you having pain? Yes NPRS scale: 8/10 at worst, 6/10 currently Pain location: Lt lower abdomen  Pain type: stabbing Pain description: intermittent - pain usually lasts 15-20 minutes when it occurs   Aggravating factors: will hurt when she is sleeping, no real pattern  Relieving factors: goes away on its own  PRECAUTIONS: None  RED  FLAGS: None   WEIGHT BEARING RESTRICTIONS: No  FALLS:  Has patient fallen in last 6 months? 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[employer]  ACTIVITY LEVEL : no current exercise  PLOF: Independent  PATIENT GOALS: decrease pain   PERTINENT HISTORY:  C-section x2, bil salpingectomy 2025 with removal of ectopic pregnancy Sexual abuse: No  BOWEL MOVEMENT: Pain with bowel movement: No Type of bowel movement:Frequency inconsistent, can go longer than 2 days - started after first c-section and Strain sometimes  Fully empty rectum: No Leakage: No Pads: No Fiber supplement/laxative No  URINATION: Pain with urination: No Fully empty bladder: Yes:   Stream: Strong Urgency: Yes - has to go immediately when she gets urge  Frequency: every 2 hours; no nocturia  Fluid Intake: not much water; does drink gatorade Leakage: Sneezing Pads: No  INTERCOURSE:  Ability to have vaginal penetration Yes  Pain with intercourse: Initial Penetration, During Penetration, and Deep Penetration DrynessNo Climax: WNL Marinoff Scale: 2/3 Lubricant: no Pain intensity: 9/10  PREGNANCY: Vaginal deliveries 0 C-section deliveries 2 Currently pregnant No  PROLAPSE: None   OBJECTIVE:  Note: Objective measures were completed at  Evaluation unless otherwise noted.  08/07/24: PATIENT SURVEYS:   PFIQ-7: 46  COGNITION: Overall cognitive status: Within functional limits for tasks assessed     SENSATION: Light touch: Appears intact   FUNCTIONAL TESTS:  Squat: WNL Single leg stance:  Rt: Stable  Lt: Stable  Curl-up test: abdominal distortion Sit-up test: 1/3 ACTIVE STRAIGHT LEG RAISE: abdominal distortion noted, more difficulty on Lt, no change with compression    GAIT: Assistive device utilized: None Comments: WNL  POSTURE: increased lumbar lordosis, decreased thoracic kyphosis, and anterior pelvic tilt   LUMBARAROM/PROM:  A/PROM A/PROM  Eval (% available)  Flexion 75, pain in Lt side  Extension 50, pain  Right lateral flexion WNL, pain Lt  Left lateral flexion WNL  Right rotation 75  Left rotation 75   (Blank rows = not tested)  PALPATION:   General: WNL  Pelvic Alignment: anterior pelvic tilt  Abdominal: apical breathing pattern, tenderness throughout lower Lt quadrant; relief with pressure at pubic symphysis (cavitation in Lt SIJ with pressure at pubic symphysis); normal sternocostal angle                External Perineal Exam: external hemorrhoids                              Internal Pelvic Floor: significant pain throughout superficial and deep Lt sided pelvic floor muscles   Patient confirms identification and approves PT to assess internal pelvic floor and treatment Yes  PELVIC MMT:   MMT eval  Vaginal 4/5, 12 second hold, 6 repeat contractions  Diastasis Recti 3 finger widths   (Blank rows = not tested)        TONE: High on Lt only  PROLAPSE: WNL  TODAY'S TREATMENT:  DATE:  08/07/24 EVAL  Neuromuscular re-education: Transversus abdominus training with multimodal cues for improved motor control and breath coordination  Seated hip adduction ball  press with transversus abdominus and pelvic floor muscle 2 x 10 Seated hip abduction red band with transversus abdominus and pelvic floor muscle 2 x 10 Bridge with hip adduction + transversus abdominus 2 x 10 Exercises: Lower trunk rotation 10x Open books 5x bil Cat cow 10x Kneeling hip flexor stretch 30 seconds bil    PATIENT EDUCATION:  Education details: See above Person educated: Patient Education method: Programmer, multimedia, Demonstration, Actor cues, Verbal cues, and Handouts Education comprehension: verbalized understanding  HOME EXERCISE PROGRAM: [id number]  ASSESSMENT:  CLINICAL IMPRESSION: Patient is a 38 y.o. female who was seen today for physical therapy evaluation and treatment for Lt lower quadrant pain and dyspareunia. Exam findings notable for decreased lumbar A/ROM with pain, core weakness, abdominal scar tissue restriction, and Lt sided pelvic floor muscle spasm. Signs and symptoms are most consistent with pelvic instability that started after pregnancies and was exacerbated after recent surgery, leading to pelvic floor muscle spasm and pain. 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 Initial treatment included gentle pelvic stability activities and stretches. Even though patient has notable pelvic floor muscle restriction on Lt, believe high level of tissue irritability will be better initially addressed throughout pelvic stability and down training activities. She will continue to benefit from skilled PT intervention in order to decrease pain, address all impairments, and improve quality of life.    OBJECTIVE IMPAIRMENTS: decreased activity tolerance, decreased coordination, decreased endurance, decreased mobility, decreased ROM, decreased strength, increased fascial restrictions, increased muscle spasms, impaired flexibility, impaired tone, improper body mechanics, postural dysfunction, and pain.   ACTIVITY LIMITATIONS: carrying, lifting, bending, sleeping, and  continence  PARTICIPATION LIMITATIONS: cleaning, interpersonal relationship, community activity, and occupation  PERSONAL FACTORS: 1 comorbidity: medical history  are also affecting patient's functional outcome.   REHAB POTENTIAL: Good  CLINICAL DECISION MAKING: Stable/uncomplicated  EVALUATION COMPLEXITY: Low   GOALS: Goals reviewed with patient? Yes  SHORT TERM GOALS: Target date: 09/04/2024   Pt will be independent with HEP in order to improve activity tolerance.   Baseline: Goal status: INITIAL  2.  Patient will report 25% improve in abdominal pain in order to increase activity tolerance.    Baseline:  Goal status: INITIAL  3.  Pt will be able to perform curl-up test without abdominal distortion in order to demonstrate improved abdominal pressure management and improve pain.   Baseline: abdominal distortion present Goal status: INITIAL  4.  Pt will be independent with use of squatty potty, relaxed toileting mechanics, and improved bowel movement techniques in order to increase ease of bowel movements and complete evacuation.   Baseline:  Goal status: INITIAL  5.  Pt will be able to teach back and utilize urge suppression technique in order to help reduce number of trips to the bathroom.    Baseline:  Goal status: INITIAL   LONG TERM GOALS: Target date: 2/11/20206  Pt will be independent with advanced HEP in order to improve activity tolerance.   Baseline:  Goal status: INITIAL  2.  Patient will report 75% improve in abdominal pain in order to increase activity tolerance.   Baseline:  Goal status: INITIAL  3.  Pt will be able to lift 25 lbs without pain in order to be able to perform work functions without difficulty Baseline: pain  Goal status: INITIAL  4.  Pt will demonstrate no pain or abnormal  tone in Lt pelvic floor muscles in order to allow for improved pelvic stability and decreased pain with lifting and sleeping.  Baseline:  Goal status:  INITIAL  5.  Pt will be able to perform sit-up test without abdominal distortion with 3/3 in order to demonstrate appropriate core strength and pressure management in functional activities for childcare.  Baseline: 1/3 with abdominal distortion  Goal status: INITIAL  6.  Pt will report increased frequency of bowel movements to at least 5x/week without straining and complete emptying in order to improve pain.  Baseline:  Goal status: INITIAL  PLAN:  PT FREQUENCY: 1-2x/week  PT DURATION: 12 visits    PLANNED INTERVENTIONS: 97164- PT Re-evaluation, 97110-Therapeutic exercises, 97530- Therapeutic activity, 97112- Neuromuscular re-education, 97535- Self Care, [id number]- Manual therapy, [phone]- Gait training, [phone]- Aquatic Therapy, [phone]- Electrical stimulation (unattended), [phone]- Traction (mechanical), [id number]- Ionotophoresis 4mg /ml Dexamethasone , 79439 (1-2 muscles), 20561 (3+ muscles)- Dry Needling, Patient/Family education, Balance training, Taping, Joint mobilization, Joint manipulation, Spinal manipulation, Spinal mobilization, Scar mobilization, Vestibular training, Cryotherapy, Moist heat, and Biofeedback  PLAN FOR NEXT SESSION: abdominal manual techniques; core strengthening; down training; bowel techniques   Josette Mares, PT, DPT08/27/2511:42 AM

## 2024-08-27 ENCOUNTER — Encounter: Payer: Self-pay | Admitting: Obstetrics and Gynecology

## 2024-09-26 ENCOUNTER — Ambulatory Visit: Attending: Obstetrics and Gynecology

## 2024-09-26 ENCOUNTER — Telehealth: Payer: Self-pay

## 2024-09-26 NOTE — Telephone Encounter (Signed)
 Called and spoke with patient after no-show appointment. She for about appointment. Next appointment confirmed and attendance policy reviewed.

## 2024-09-26 NOTE — Therapy (Incomplete)
 OUTPATIENT PHYSICAL THERAPY FEMALE PELVIC TREATMENT   Patient Name: Patricia Parrish MRN: [id number] DOB:1986/05/17, 38 y.o., female Today's Date: 09/26/2024  END OF SESSION:    Past Medical History:  Diagnosis Date   Medical history non-contributory    Past Surgical History:  Procedure Laterality Date   CESAREAN SECTION     CESAREAN SECTION WITH BILATERAL TUBAL LIGATION     DIAGNOSTIC LAPAROSCOPY WITH REMOVAL OF ECTOPIC PREGNANCY N/A 03/20/2024   Procedure: LAPAROSCOPIC REMOVAL OF ECTOPIC PREGNANCY;  Surgeon: Erik Kieth BROCKS, MD;  Location: [facility] OR;  Service: Gynecology;  Laterality: N/A;   LAPAROSCOPIC BILATERAL SALPINGECTOMY Bilateral 03/20/2024   Procedure: SALPINGECTOMY, BILATERAL, LAPAROSCOPIC;  Surgeon: Erik Kieth BROCKS, MD;  Location: [facility] OR;  Service: Gynecology;  Laterality: Bilateral;   LAPAROSCOPY Bilateral 03/20/2024   Procedure: DIAGNOSTIC LAPAROSCOPY;  Surgeon: Erik Kieth BROCKS, MD;  Location: [facility] OR;  Service: Gynecology;  Laterality: Bilateral;   Patient Active Problem List   Diagnosis Date Noted   Acute blood loss anemia 03/20/2024    PCP: NA  REFERRING PROVIDER: Erik Kieth BROCKS, MD   REFERRING DIAG: N94.10 (ICD-10-CM) - Dyspareunia in female K40.00 (ICD-10-CM) - Constipation, unspecified constipation type R30.0 (ICD-10-CM) - Dysuria  THERAPY DIAG:  No diagnosis found.  Rationale for Evaluation and Treatment: Rehabilitation  ONSET DATE: 03/2024  SUBJECTIVE:                                                                                                                                                                                           SUBJECTIVE STATEMENT: Pt states that she has been having persistent Lt lower quadrant pain since bil salpingectomy after ectopic pregnancy in April 2025.    PAIN:  Are you having pain? Yes NPRS scale: 8/10 at worst, 6/10 currently Pain location: Lt lower abdomen  Pain type: stabbing Pain description:  intermittent - pain usually lasts 15-20 minutes when it occurs   Aggravating factors: will hurt when she is sleeping, no real pattern  Relieving factors: goes away on its own  PRECAUTIONS: None  RED FLAGS: None   WEIGHT BEARING RESTRICTIONS: No  FALLS:  Has patient fallen in last 6 months? 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[employer]  ACTIVITY LEVEL : no current exercise  PLOF: Independent  PATIENT GOALS: decrease pain   PERTINENT HISTORY:  C-section x2, bil salpingectomy 2025 with removal of ectopic pregnancy Sexual abuse: No  BOWEL MOVEMENT: Pain with bowel movement: No Type of bowel movement:Frequency inconsistent, can go longer than 2 days - started after first c-section and Strain sometimes  Fully empty rectum: No Leakage: No Pads: No Fiber supplement/laxative No  URINATION: Pain with urination: No Fully empty bladder: Yes:   Stream: Strong Urgency: Yes - has to go immediately when she gets urge  Frequency: every 2 hours; no nocturia  Fluid Intake: not much water; does drink gatorade Leakage: Sneezing Pads: No  INTERCOURSE:  Ability to have vaginal penetration Yes  Pain with intercourse: Initial Penetration, During Penetration, and Deep Penetration DrynessNo Climax: WNL Marinoff Scale: 2/3 Lubricant: no Pain intensity: 9/10  PREGNANCY: Vaginal deliveries 0 C-section deliveries 2 Currently pregnant No  PROLAPSE: None   OBJECTIVE:  Note: Objective measures were completed at Evaluation unless otherwise noted.  08/07/24: PATIENT SURVEYS:   PFIQ-7: 28  COGNITION: Overall cognitive status: Within functional limits for tasks assessed     SENSATION: Light touch: Appears intact   FUNCTIONAL TESTS:  Squat: WNL Single leg stance:  Rt: Stable  Lt: Stable  Curl-up test: abdominal distortion Sit-up test: 1/3 ACTIVE STRAIGHT LEG RAISE: abdominal distortion noted, more difficulty on Lt, no change with compression    GAIT: Assistive  device utilized: None Comments: WNL  POSTURE: increased lumbar lordosis, decreased thoracic kyphosis, and anterior pelvic tilt   LUMBARAROM/PROM:  A/PROM A/PROM  Eval (% available)  Flexion 75, pain in Lt side  Extension 50, pain  Right lateral flexion WNL, pain Lt  Left lateral flexion WNL  Right rotation 75  Left rotation 75   (Blank rows = not tested)  PALPATION:   General: WNL  Pelvic Alignment: anterior pelvic tilt  Abdominal: apical breathing pattern, tenderness throughout lower Lt quadrant; relief with pressure at pubic symphysis (cavitation in Lt SIJ with pressure at pubic symphysis); normal sternocostal angle                External Perineal Exam: external hemorrhoids                              Internal Pelvic Floor: significant pain throughout superficial and deep Lt sided pelvic floor muscles   Patient confirms identification and approves PT to assess internal pelvic floor and treatment Yes  PELVIC MMT:   MMT eval  Vaginal 4/5, 12 second hold, 6 repeat contractions  Diastasis Recti 3 finger widths   (Blank rows = not tested)        TONE: High on Lt only  PROLAPSE: WNL  TODAY'S TREATMENT:                                                                                                                              DATE:  08/07/24 EVAL  Neuromuscular re-education: Transversus abdominus training with multimodal cues for improved motor control and breath coordination  Seated hip adduction ball press with transversus abdominus and pelvic floor muscle 2 x 10 Seated hip abduction red band with transversus abdominus and pelvic floor muscle 2 x 10 Bridge with hip adduction + transversus abdominus 2 x 10 Exercises: Lower trunk rotation 10x Open books  5x bil Cat cow 10x Kneeling hip flexor stretch 30 seconds bil    PATIENT EDUCATION:  Education details: See above Person educated: Patient Education method: Explanation, Demonstration, Tactile cues, Verbal  cues, and Handouts Education comprehension: verbalized understanding  HOME EXERCISE PROGRAM: [id number]  ASSESSMENT:  CLINICAL IMPRESSION: Patient is a 38 y.o. female who was seen today for physical therapy evaluation and treatment for Lt lower quadrant pain and dyspareunia. Exam findings notable for decreased lumbar A/ROM with pain, core weakness, abdominal scar tissue restriction, and Lt sided pelvic floor muscle spasm. Signs and symptoms are most consistent with pelvic instability that started after pregnancies and was exacerbated after recent surgery, leading to pelvic floor muscle spasm and pain. 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 Initial treatment included gentle pelvic stability activities and stretches. Even though patient has notable pelvic floor muscle restriction on Lt, believe high level of tissue irritability will be better initially addressed throughout pelvic stability and down training activities. She will continue to benefit from skilled PT intervention in order to decrease pain, address all impairments, and improve quality of life.    OBJECTIVE IMPAIRMENTS: decreased activity tolerance, decreased coordination, decreased endurance, decreased mobility, decreased ROM, decreased strength, increased fascial restrictions, increased muscle spasms, impaired flexibility, impaired tone, improper body mechanics, postural dysfunction, and pain.   ACTIVITY LIMITATIONS: carrying, lifting, bending, sleeping, and continence  PARTICIPATION LIMITATIONS: cleaning, interpersonal relationship, community activity, and occupation  PERSONAL FACTORS: 1 comorbidity: medical history  are also affecting patient's functional outcome.   REHAB POTENTIAL: Good  CLINICAL DECISION MAKING: Stable/uncomplicated  EVALUATION COMPLEXITY: Low   GOALS: Goals reviewed with patient? Yes  SHORT TERM GOALS: Target date: 09/04/2024   Pt will be independent with HEP in order to improve activity  tolerance.   Baseline: Goal status: INITIAL  2.  Patient will report 25% improve in abdominal pain in order to increase activity tolerance.    Baseline:  Goal status: INITIAL  3.  Pt will be able to perform curl-up test without abdominal distortion in order to demonstrate improved abdominal pressure management and improve pain.   Baseline: abdominal distortion present Goal status: INITIAL  4.  Pt will be independent with use of squatty potty, relaxed toileting mechanics, and improved bowel movement techniques in order to increase ease of bowel movements and complete evacuation.   Baseline:  Goal status: INITIAL  5.  Pt will be able to teach back and utilize urge suppression technique in order to help reduce number of trips to the bathroom.    Baseline:  Goal status: INITIAL   LONG TERM GOALS: Target date: 2/11/20206  Pt will be independent with advanced HEP in order to improve activity tolerance.   Baseline:  Goal status: INITIAL  2.  Patient will report 75% improve in abdominal pain in order to increase activity tolerance.   Baseline:  Goal status: INITIAL  3.  Pt will be able to lift 25 lbs without pain in order to be able to perform work functions without difficulty Baseline: pain  Goal status: INITIAL  4.  Pt will demonstrate no pain or abnormal tone in Lt pelvic floor muscles in order to allow for improved pelvic stability and decreased pain with lifting and sleeping.  Baseline:  Goal status: INITIAL  5.  Pt will be able to perform sit-up test without abdominal distortion with 3/3 in order to demonstrate appropriate core strength and pressure management in functional activities for childcare.  Baseline: 1/3 with abdominal distortion  Goal status: INITIAL  6.  Pt will report increased frequency of bowel movements to at least 5x/week without straining and complete emptying in order to improve pain.  Baseline:  Goal status: INITIAL  PLAN:  PT FREQUENCY:  1-2x/week  PT DURATION: 12 visits    PLANNED INTERVENTIONS: 97164- PT Re-evaluation, 97110-Therapeutic exercises, 97530- Therapeutic activity, 97112- Neuromuscular re-education, 97535- Self Care, 02859- Manual therapy, [phone]- Gait training, [phone]- Aquatic Therapy, [phone]- Electrical stimulation (unattended), [phone]- Traction (mechanical), [id number]- Ionotophoresis 4mg /ml Dexamethasone , 79439 (1-2 muscles), 20561 (3+ muscles)- Dry Needling, Patient/Family education, Balance training, Taping, Joint mobilization, Joint manipulation, Spinal manipulation, Spinal mobilization, Scar mobilization, Vestibular training, Cryotherapy, Moist heat, and Biofeedback  PLAN FOR NEXT SESSION: abdominal manual techniques; core strengthening; down training; bowel techniques   Josette Mares, PT, DPT10/16/251:58 PM

## 2024-10-15 ENCOUNTER — Ambulatory Visit: Attending: Obstetrics and Gynecology

## 2024-10-15 DIAGNOSIS — R102 Pelvic and perineal pain unspecified side: Secondary | ICD-10-CM | POA: Diagnosis not present

## 2024-10-15 DIAGNOSIS — R279 Unspecified lack of coordination: Secondary | ICD-10-CM | POA: Diagnosis not present

## 2024-10-15 DIAGNOSIS — M62838 Other muscle spasm: Secondary | ICD-10-CM | POA: Insufficient documentation

## 2024-10-15 DIAGNOSIS — M6281 Muscle weakness (generalized): Secondary | ICD-10-CM | POA: Diagnosis not present

## 2024-10-15 NOTE — Therapy (Signed)
 OUTPATIENT PHYSICAL THERAPY FEMALE PELVIC TREATMENT   Patient Name: Patricia Parrish MRN: [id number] DOB:1986/12/10, 38 y.o., female Today's Date: 10/15/2024  END OF SESSION:  PT End of Session - 10/15/24 0807     Visit Number 2    Date for Recertification  01/22/25    Authorization Type [employer]    PT Start Time 0805    PT Stop Time [phone]    PT Time Calculation (min) 38 min    Activity Tolerance Patient tolerated treatment well    Behavior During Therapy [facility] for tasks assessed/performed           Past Medical History:  Diagnosis Date   Medical history non-contributory    Past Surgical History:  Procedure Laterality Date   CESAREAN SECTION     CESAREAN SECTION WITH BILATERAL TUBAL LIGATION     DIAGNOSTIC LAPAROSCOPY WITH REMOVAL OF ECTOPIC PREGNANCY N/A 03/20/2024   Procedure: LAPAROSCOPIC REMOVAL OF ECTOPIC PREGNANCY;  Surgeon: Erik Kieth BROCKS, MD;  Location: [facility] OR;  Service: Gynecology;  Laterality: N/A;   LAPAROSCOPIC BILATERAL SALPINGECTOMY Bilateral 03/20/2024   Procedure: SALPINGECTOMY, BILATERAL, LAPAROSCOPIC;  Surgeon: Erik Kieth BROCKS, MD;  Location: [facility] OR;  Service: Gynecology;  Laterality: Bilateral;   LAPAROSCOPY Bilateral 03/20/2024   Procedure: DIAGNOSTIC LAPAROSCOPY;  Surgeon: Erik Kieth BROCKS, MD;  Location: [facility] OR;  Service: Gynecology;  Laterality: Bilateral;   Patient Active Problem List   Diagnosis Date Noted   Acute blood loss anemia 03/20/2024    PCP: NA  REFERRING PROVIDER: Erik Kieth BROCKS, MD   REFERRING DIAG: N94.10 (ICD-10-CM) - Dyspareunia in female K83.00 (ICD-10-CM) - Constipation, unspecified constipation type R30.0 (ICD-10-CM) - Dysuria  THERAPY DIAG:  Pelvic pain  Other muscle spasm  Muscle weakness (generalized)  Unspecified lack of coordination  Rationale for Evaluation and Treatment: Rehabilitation  ONSET DATE: 03/2024  SUBJECTIVE:                                                                                                                                                                                            SUBJECTIVE STATEMENT: Pt states that pain is still the same. She tries to do the exercises when the pain comes to help ease.    PAIN: 10/15/24 Are you having pain? Yes NPRS scale: 7/10 Pain location: Lt lower abdomen  Pain type: stabbing Pain description: intermittent - pain usually lasts 15-20 minutes when it occurs   Aggravating factors: will hurt when she is sleeping, no real pattern  Relieving factors: goes away on its own  PRECAUTIONS: None  RED FLAGS: None   WEIGHT BEARING RESTRICTIONS: No  FALLS:  Has patient fallen in last 6 months? 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[employer]  ACTIVITY LEVEL : no current exercise  PLOF: Independent  PATIENT GOALS: decrease pain   PERTINENT HISTORY:  C-section x2, bil salpingectomy 2025 with removal of ectopic pregnancy Sexual abuse: No  BOWEL MOVEMENT: Pain with bowel movement: No Type of bowel movement:Frequency inconsistent, can go longer than 2 days - started after first c-section and Strain sometimes  Fully empty rectum: No Leakage: No Pads: No Fiber supplement/laxative No  URINATION: Pain with urination: No Fully empty bladder: Yes:   Stream: Strong Urgency: Yes - has to go immediately when she gets urge  Frequency: every 2 hours; no nocturia  Fluid Intake: not much water; does drink gatorade Leakage: Sneezing Pads: No  INTERCOURSE:  Ability to have vaginal penetration Yes  Pain with intercourse: Initial Penetration, During Penetration, and Deep Penetration DrynessNo Climax: WNL Marinoff Scale: 2/3 Lubricant: no Pain intensity: 9/10  PREGNANCY: Vaginal deliveries 0 C-section deliveries 2 Currently pregnant No  PROLAPSE: None   OBJECTIVE:  Note: Objective measures were completed at Evaluation unless otherwise noted.  08/07/24: PATIENT SURVEYS:   PFIQ-7:  36  COGNITION: Overall cognitive status: Within functional limits for tasks assessed     SENSATION: Light touch: Appears intact   FUNCTIONAL TESTS:  Squat: WNL Single leg stance:  Rt: Stable  Lt: Stable  Curl-up test: abdominal distortion Sit-up test: 1/3 ACTIVE STRAIGHT LEG RAISE: abdominal distortion noted, more difficulty on Lt, no change with compression    GAIT: Assistive device utilized: None Comments: WNL  POSTURE: increased lumbar lordosis, decreased thoracic kyphosis, and anterior pelvic tilt   LUMBARAROM/PROM:  A/PROM A/PROM  Eval (% available)  Flexion 75, pain in Lt side  Extension 50, pain  Right lateral flexion WNL, pain Lt  Left lateral flexion WNL  Right rotation 75  Left rotation 75   (Blank rows = not tested)  PALPATION:   General: WNL  Pelvic Alignment: anterior pelvic tilt  Abdominal: apical breathing pattern, tenderness throughout lower Lt quadrant; relief with pressure at pubic symphysis (cavitation in Lt SIJ with pressure at pubic symphysis); normal sternocostal angle                External Perineal Exam: external hemorrhoids                              Internal Pelvic Floor: significant pain throughout superficial and deep Lt sided pelvic floor muscles   Patient confirms identification and approves PT to assess internal pelvic floor and treatment Yes  PELVIC MMT:   MMT eval  Vaginal 4/5, 12 second hold, 6 repeat contractions  Diastasis Recti 3 finger widths   (Blank rows = not tested)        TONE: High on Lt only  PROLAPSE: WNL  TODAY'S TREATMENT:  DATE:  10/15/24 Manual Supine Lt lower quadrant and c-section scar tissue mobilization Supine Lt lower quadrant soft tissue mobilization  Supine negative pressure soft tissue mobilization to Lt lower quadrant Neuromuscular re-education: Bridge with hip  adduction + transversus abdominus 2 x 10 Supine resisted bent knee fall out + red band 10x bil Supine active straight leg raise 10x bil Exercises: Kneeling hip flexor stretch 60 sec bil   08/07/24 EVAL  Neuromuscular re-education: Transversus abdominus training with multimodal cues for improved motor control and breath coordination  Seated hip adduction ball press with transversus abdominus and pelvic floor muscle 2 x 10 Seated hip abduction red band with transversus abdominus and pelvic floor muscle 2 x 10 Bridge with hip adduction + transversus abdominus 2 x 10 Exercises: Lower trunk rotation 10x Open books 5x bil Cat cow 10x Kneeling hip flexor stretch 30 seconds bil    PATIENT EDUCATION:  Education details: See above Person educated: Patient Education method: Programmer, Multimedia, Facilities Manager, Actor cues, Verbal cues, and Handouts Education comprehension: verbalized understanding  HOME EXERCISE PROGRAM: [id number]  ASSESSMENT:  CLINICAL IMPRESSION: Patient is a 38 y.o. female who was seen today for physical therapy evaluation and treatment for Lt lower quadrant pain and dyspareunia. Pt has not seen any change in pain yet. She is doing exercises only when she has pain and was encouraged to perform some on a daily basis to help prevent pain from occurring, decreasing abdominal restriction. She did very well with all manual techniques and exercises today. Hep updated. She will continue to benefit from skilled PT intervention in order to decrease pain, address all impairments, and improve quality of life.    OBJECTIVE IMPAIRMENTS: decreased activity tolerance, decreased coordination, decreased endurance, decreased mobility, decreased ROM, decreased strength, increased fascial restrictions, increased muscle spasms, impaired flexibility, impaired tone, improper body mechanics, postural dysfunction, and pain.   ACTIVITY LIMITATIONS: carrying, lifting, bending, sleeping, and  continence  PARTICIPATION LIMITATIONS: cleaning, interpersonal relationship, community activity, and occupation  PERSONAL FACTORS: 1 comorbidity: medical history  are also affecting patient's functional outcome.   REHAB POTENTIAL: Good  CLINICAL DECISION MAKING: Stable/uncomplicated  EVALUATION COMPLEXITY: Low   GOALS: Goals reviewed with patient? Yes  SHORT TERM GOALS: Target date: 09/04/2024   Pt will be independent with HEP in order to improve activity tolerance.   Baseline: Goal status: IN PROGRESS 10/15/2024  2.  Patient will report 25% improve in abdominal pain in order to increase activity tolerance.    Baseline:  Goal status: IN PROGRESS 10/15/2024  3.  Pt will be able to perform curl-up test without abdominal distortion in order to demonstrate improved abdominal pressure management and improve pain.   Baseline: abdominal distortion present Goal status: IN PROGRESS 10/15/2024  4.  Pt will be independent with use of squatty potty, relaxed toileting mechanics, and improved bowel movement techniques in order to increase ease of bowel movements and complete evacuation.   Baseline:  Goal status: IN PROGRESS 10/15/2024  5.  Pt will be able to teach back and utilize urge suppression technique in order to help reduce number of trips to the bathroom.    Baseline:  Goal status: IN PROGRESS 10/15/2024   LONG TERM GOALS: Target date: 2/11/20206  Pt will be independent with advanced HEP in order to improve activity tolerance.   Baseline:  Goal status: IN PROGRESS 10/15/2024  2.  Patient will report 75% improve in abdominal pain in order to increase activity tolerance.   Baseline:  Goal status: IN PROGRESS 10/15/2024  3.  Pt will be able to lift 25 lbs without pain in order to be able to perform work functions without difficulty Baseline: pain  Goal status: IN PROGRESS 10/15/2024  4.  Pt will demonstrate no pain or abnormal tone in Lt pelvic floor muscles in order to  allow for improved pelvic stability and decreased pain with lifting and sleeping.  Baseline:  Goal status: IN PROGRESS 10/15/2024  5.  Pt will be able to perform sit-up test without abdominal distortion with 3/3 in order to demonstrate appropriate core strength and pressure management in functional activities for childcare.  Baseline: 1/3 with abdominal distortion  Goal status: IN PROGRESS 10/15/2024  6.  Pt will report increased frequency of bowel movements to at least 5x/week without straining and complete emptying in order to improve pain.  Baseline:  Goal status: IN PROGRESS 10/15/2024  PLAN:  PT FREQUENCY: 1-2x/week  PT DURATION: 12 visits    PLANNED INTERVENTIONS: 97164- PT Re-evaluation, 97110-Therapeutic exercises, 97530- Therapeutic activity, 97112- Neuromuscular re-education, 97535- Self Care, 02859- Manual therapy, [phone]- Gait training, [phone]- Aquatic Therapy, [phone]- Electrical stimulation (unattended), [phone]- Traction (mechanical), [id number]- Ionotophoresis 4mg /ml Dexamethasone , 79439 (1-2 muscles), 20561 (3+ muscles)- Dry Needling, Patient/Family education, Balance training, Taping, Joint mobilization, Joint manipulation, Spinal manipulation, Spinal mobilization, Scar mobilization, Vestibular training, Cryotherapy, Moist heat, and Biofeedback  PLAN FOR NEXT SESSION: abdominal manual techniques; core strengthening; down training; bowel techniques   Josette Mares, PT, DPT11/04/258:45 AM [facility] [address] [place], [place] [address] Phone # [phone] Fax [phone]

## 2024-10-22 ENCOUNTER — Telehealth: Payer: Self-pay

## 2024-10-22 ENCOUNTER — Ambulatory Visit

## 2024-10-22 NOTE — Telephone Encounter (Signed)
 Called and left message after second no-show appointment. Per attendance policy, we are removing her next/last appointment. She was told that she can make appointment on a week by week basis calling in to see if there are openings. She was asked to message or call if she would like to discharge.

## 2024-10-29 ENCOUNTER — Encounter

## 2024-11-25 ENCOUNTER — Encounter: Payer: Self-pay | Admitting: Obstetrics and Gynecology

## 2024-12-26 ENCOUNTER — Other Ambulatory Visit: Payer: Self-pay

## 2024-12-27 ENCOUNTER — Ambulatory Visit: Payer: Self-pay | Admitting: Obstetrics and Gynecology

## 2024-12-27 ENCOUNTER — Encounter: Payer: Self-pay | Admitting: Obstetrics and Gynecology

## 2024-12-27 ENCOUNTER — Other Ambulatory Visit (HOSPITAL_COMMUNITY)
Admission: RE | Admit: 2024-12-27 | Discharge: 2024-12-27 | Disposition: A | Source: Ambulatory Visit | Attending: Obstetrics and Gynecology | Admitting: Obstetrics and Gynecology

## 2024-12-27 VITALS — BP 114/74 | HR 89 | Ht 67.0 in | Wt 135.0 lb

## 2024-12-27 DIAGNOSIS — Z758 Other problems related to medical facilities and other health care: Secondary | ICD-10-CM

## 2024-12-27 DIAGNOSIS — R102 Pelvic and perineal pain unspecified side: Secondary | ICD-10-CM | POA: Diagnosis present

## 2024-12-27 DIAGNOSIS — R002 Palpitations: Secondary | ICD-10-CM

## 2024-12-27 LAB — POCT URINALYSIS DIPSTICK
Bilirubin, UA: NEGATIVE
Blood, UA: NEGATIVE
Glucose, UA: NEGATIVE
Ketones, UA: NEGATIVE
Leukocytes, UA: NEGATIVE
Nitrite, UA: NEGATIVE
Protein, UA: NEGATIVE
Spec Grav, UA: 1.02
Urobilinogen, UA: 0.2 U/dL
pH, UA: 6

## 2024-12-27 LAB — POCT URINE PREGNANCY: Preg Test, Ur: NEGATIVE

## 2024-12-27 NOTE — Progress Notes (Signed)
" ° °  RETURN GYNECOLOGY VISIT  Subjective:  Patricia Parrish is a 39 y.o. G3P0 with Nexplanon  in place presenting for persistent LLQ pelvic pain, palpitations  Has a few concerns today.  Persistent LLQ pain - chronic pain worsened after her surgery for ruptured ectopic pregnancy. Pain seems to occur randomly and when it happens she can't do anything because it is so severe. Has tried stretching during these episodes of pain without relief. Also struggles with constipation and straining with bowel movements. She did 2 sessions of PFPT but didn't think it was helping so she hasn't gone back. Notes discharge consistent with VVC Irregular bleeding - occurring with nexplanon . She feels terrible when she has bleeding.. dizzy, nauseated. Did not have irregular periods before her nexplanon  and she would like it removed Palpitations - occurring daily, last for about 5 minutes and associated with a stabbing pain. No shortness of breath, occurs at rest. Thinks it could be stress related  Objective:   Vitals:   12/27/24 1117  BP: 114/74  Pulse: 89  Weight: 135 lb (61.2 kg)  Height: 5' 7 (1.702 m)    General:  Alert, oriented and cooperative. Patient is in no acute distress.  Skin: Skin is warm and dry. No rash noted.   Cardiovascular: Normal heart rate noted  Respiratory: Normal respiratory effort, no problems with respiration noted  Abdomen: Soft, non-tender, non-distended   Pelvic: NEFG. Minor levator/obturator tenderness on the left. Point abdominal wall tenderness in LLQ. No palpable adnexal mass.  Uterus is small, mobile and mildly tender  Exam performed in the presence of a chaperone  Assessment and Plan:  Patricia Parrish is a 39 y.o. 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 Given chest pain associated w/ palpitations will refer to cardiology. Labs to be collected next visit -     EKG 12-Lead -     Ambulatory referral to Internal Medicine -     HOLTER MONITOR - 48 HOUR; Future -     BMP -     CBC -     TSH -     Ambulatory referral to Cardiology  Does not have primary care provider -     Ambulatory referral to Internal Medicine  Return for nexplanon  removal.  Future Appointments  Date Time Provider Department Center  01/06/2025  9:00 AM [facility]  1 [facility]  [facility]  01/27/2025  1:30 PM Constant, [place], Patricia Parrish [facility] None   Patricia JAYSON Carolin, Patricia Parrish  "

## 2024-12-27 NOTE — Patient Instructions (Addendum)
 Helpful things for constipation  - Kiwi fruit with skin on (only 1 per day!) - Increase fiber in diet and drink lots of water - Over the counter laxatives - Miralax  or senna daily. Senna can cause more cramping, so Miralax  might be most helpful. Drink with lots of water or you can get dehydrated   Nature Conservation Officer at [facility] Address: [address], [place], [place] [address] Phone: [phone]  My PCP is Vickie Hensen

## 2024-12-27 NOTE — Progress Notes (Signed)
 39 y.o. GYN presents for Pelvic Pain 8/10 x 9 months. Denies dysuria, discharge, odor, fever, NV, chills.  Last PAP 03/17/2022 NILM

## 2024-12-30 ENCOUNTER — Ambulatory Visit: Payer: Self-pay | Admitting: Obstetrics and Gynecology

## 2024-12-30 LAB — CERVICOVAGINAL ANCILLARY ONLY
Candida Glabrata: NEGATIVE
Candida Vaginitis: NEGATIVE
Comment: NEGATIVE
Comment: NEGATIVE

## 2025-01-01 NOTE — Progress Notes (Addendum)
 "  Cardiology [facility]:    Date:  01/02/2025   ID:  Patricia Parrish, DOB 1986/10/19, MRN [id number]  PCP:  Pcp, No  Cardiologist:  None     Referring MD: Erik Kieth BROCKS, MD   Chief Complaint: palpitations  History of Present Illness:    Patricia Parrish is a 39 y.o. female with a history of ruptured ectopic pregnancy who presents today as a new patient in the [facility] for further evaluation of palpitations.   Patient was recently seen by her OBGYN on 12/27/2024 at which time she reported palpitations. TSH, BMP, CBC, and Holter Monitor were ordered for further evaluation and she was referred to Cardiology. She has not had labs checked yet.   Patient reports she has had palpitation since her last pregnancy about 2 year ago but states it has been worse over the last 6 months. She states they are sporadic but have been occurring on almost a daily basis. Often occurs when she is at work lifting things. Palpitations typically last for about 5 minutes and will resolve when she stops to rest. She has some mild lightheadedness with it but no syncope. She tried cutting out caffeine but had no improvement with this. No other cardiac symptoms.  She drinks alcohol socially but denies any history of alcohol or drug use.   She denies any family history of cardiovascular disease.   ROS: No chest pain, shortness of breath, orthopnea, PND, lower extremity edema, syncope.  EKGs/Labs/Other Studies Reviewed:    The following studies were reviewed:  N/A  EKG:  EKG ordered today.   EKG Interpretation Date/Time:  Thursday January 02 2025 09:01:02 EST Ventricular Rate:  80 PR Interval:  132 QRS Duration:  74 QT Interval:  372 QTC Calculation: 429 R Axis:   80  Text Interpretation: Normal sinus rhythm  Non-specific T wave changes Confirmed by Torrey Ballinas [phone]) on 01/02/2025 9:18:38 AM    Recent Labs: 03/20/2024: ALT 14; BUN 12; Creatinine, Ser 0.88; Potassium 3.5;  Sodium 136 03/21/2024: Hemoglobin 8.3; Platelets 152  Recent Lipid Panel    Component Value Date/Time   CHOL 97 01/25/2018 1532   TRIG 48.0 01/25/2018 1532   HDL 55.10 01/25/2018 1532   CHOLHDL 2 01/25/2018 1532   VLDL 9.6 01/25/2018 1532   LDLCALC 32 01/25/2018 1532    Physical Exam:    Vital Signs: BP 114/76   Pulse 80   Ht 5' 7 (1.702 m)   Wt 136 lb 1.3 oz (61.7 kg)   LMP 12/23/2024 (Exact Date)   SpO2 99%   BMI 21.31 kg/m     Wt Readings from Last 3 Encounters:  01/02/25 136 lb 1.3 oz (61.7 kg)  12/27/24 135 lb (61.2 kg)  06/06/24 133 lb (60.3 kg)     General: 39 y.o. African American female in no acute distress. HEENT: Normocephalic and atraumatic. Sclera clear.  Neck: Supple. No carotid bruits. No JVD. Heart: RRR. Distinct S1 and S2. No murmurs, gallops, or rubs.  Lungs: No increased work of breathing. Clear to ausculation bilaterally. No wheezes, rhonchi, or rales.  Extremities: No lower extremity edema.  Skin: Warm and dry. Neuro: No focal deficits. Psych: Normal affect. Responds appropriately.  Assessment:    1. Palpitations     Plan:    Palpitations Patient reports intermittent palpitations for the last 2 years but they have worsened over the last 6 months.  Now occurring on a daily basis.  She has some associated  lightheadedness with this but no syncope. She tried cutting out caffeine with no improvement.  - EKG shows normal sinus rhythm with no ectopy or acute ischemic changes.  - Will check CBC, BMET, Magnesium, and TSH.  - OBGYN has already ordered a Holter monitor so will wait for these results. Question whether she could be having episodes of PAT/ SVT.   Disposition: Follow up in 2 months.   Bonney Aline FORBES Jadine, PA-C  01/02/2025 9:43 AM    [facility]  ADDENDUM 01/16/2025: Dr. Erik (OBGYN) reached out to me. Her office is having trouble coordinating the Holter Monitor and asked if we could help. We will be happy to help  with this. I will re-order a 2 week Zio monitor. Will have Dr. Lavona read monitor results since he was DOD on day of visit.   Adrian Dinovo E Takenya Travaglini, PA-C 01/16/2025 8:36 AM   "

## 2025-01-02 ENCOUNTER — Encounter: Payer: Self-pay | Admitting: Student

## 2025-01-02 ENCOUNTER — Ambulatory Visit: Attending: Student | Admitting: Student

## 2025-01-02 VITALS — BP 114/76 | HR 80 | Ht 67.0 in | Wt 136.1 lb

## 2025-01-02 DIAGNOSIS — R002 Palpitations: Secondary | ICD-10-CM

## 2025-01-02 NOTE — Patient Instructions (Signed)
 Medication Instructions:  Your physician recommends that you continue on your current medications as directed. Please refer to the Current Medication list given to you today.  *If you need a refill on your cardiac medications before your next appointment, please call your pharmacy*  Lab Work: TODAY:  BMET, CBC, MAG, & TSH  If you have labs (blood work) drawn today and your tests are completely normal, you will receive your results only by: MyChart Message (if you have MyChart) OR A paper copy in the mail If you have any lab test that is abnormal or we need to change your treatment, we will call you to review the results.  Testing/Procedures: None ordered  Follow-Up: At [facility], you and your health needs are our priority.  As part of our continuing mission to provide you with exceptional heart care, our providers are all part of one team.  This team includes your primary Cardiologist (physician) and Advanced Practice Providers or APPs (Physician Assistants and Nurse Practitioners) who all work together to provide you with the care you need, when you need it.  Your next appointment:   2 month(s)  Provider:   Callie Goodrich, PA-C          We recommend signing up for the patient portal called MyChart.  Sign up information is provided on this After Visit Summary.  MyChart is used to connect with patients for Virtual Visits (Telemedicine).  Patients are able to view lab/test results, encounter notes, upcoming appointments, etc.  Non-urgent messages can be sent to your provider as well.   To learn more about what you can do with MyChart, go to [email or web address].   Other Instructions

## 2025-01-03 LAB — CBC
Hematocrit: 39.7 % (ref 34.0–46.6)
Hemoglobin: 12.7 g/dL (ref 11.1–15.9)
MCH: 29.5 pg (ref 26.6–33.0)
MCHC: 32 g/dL (ref 31.5–35.7)
MCV: 92 fL (ref 79–97)
Platelets: 216 x10E3/uL (ref 150–450)
RBC: 4.31 x10E6/uL (ref 3.77–5.28)
RDW: 12.7 % (ref 11.7–15.4)
WBC: 3.5 x10E3/uL (ref 3.4–10.8)

## 2025-01-03 LAB — TSH+FREE T4
Free T4: 1.31 ng/dL (ref 0.82–1.77)
TSH: 0.932 u[IU]/mL (ref 0.450–4.500)

## 2025-01-03 LAB — BASIC METABOLIC PANEL WITH GFR
BUN/Creatinine Ratio: 13 (ref 9–23)
BUN: 10 mg/dL (ref 6–20)
CO2: 20 mmol/L (ref 20–29)
Calcium: 8.9 mg/dL (ref 8.7–10.2)
Chloride: 104 mmol/L (ref 96–106)
Creatinine, Ser: 0.77 mg/dL (ref 0.57–1.00)
Glucose: 64 mg/dL — ABNORMAL LOW (ref 70–99)
Potassium: 4.3 mmol/L (ref 3.5–5.2)
Sodium: 139 mmol/L (ref 134–144)
eGFR: 102 mL/min/1.73

## 2025-01-03 LAB — MAGNESIUM: Magnesium: 2 mg/dL (ref 1.6–2.3)

## 2025-01-04 ENCOUNTER — Ambulatory Visit: Payer: Self-pay | Admitting: Student

## 2025-01-06 ENCOUNTER — Ambulatory Visit (HOSPITAL_COMMUNITY)

## 2025-01-16 ENCOUNTER — Ambulatory Visit

## 2025-01-16 DIAGNOSIS — R002 Palpitations: Secondary | ICD-10-CM

## 2025-01-16 NOTE — Progress Notes (Unsigned)
 Enrolled for [employer] to mail a ZIO XT long term holter monitor to the patients address on file.  ? ?Dr. Antoine Poche to read. ?

## 2025-01-16 NOTE — Addendum Note (Signed)
 Addended by: Daquawn Seelman on: 01/16/2025 08:38 AM   Modules accepted: Orders

## 2025-01-27 ENCOUNTER — Ambulatory Visit: Payer: Self-pay | Admitting: Obstetrics and Gynecology

## 2025-03-03 ENCOUNTER — Ambulatory Visit: Admitting: Student
